# Patient Record
Sex: Female | Born: 1970 | Marital: Married | Smoking: Former smoker
Health system: Southern US, Community
[De-identification: ages and names within clinical notes are randomized; demographics above are authoritative.]

## PROBLEM LIST (undated history)

## (undated) DIAGNOSIS — K449 Diaphragmatic hernia without obstruction or gangrene: Secondary | ICD-10-CM

## (undated) DIAGNOSIS — E669 Obesity, unspecified: Secondary | ICD-10-CM

## (undated) DIAGNOSIS — I10 Essential (primary) hypertension: Secondary | ICD-10-CM

## (undated) DIAGNOSIS — E119 Type 2 diabetes mellitus without complications: Secondary | ICD-10-CM

## (undated) HISTORY — PX: APPENDECTOMY: SHX54

---

## 2000-11-01 ENCOUNTER — Encounter (HOSPITAL_BASED_OUTPATIENT_CLINIC_OR_DEPARTMENT_OTHER): Payer: Self-pay | Admitting: General Surgery

## 2000-11-01 ENCOUNTER — Inpatient Hospital Stay (HOSPITAL_COMMUNITY): Admission: EM | Admit: 2000-11-01 | Discharge: 2000-11-05 | Payer: Self-pay | Admitting: Emergency Medicine

## 2001-10-25 ENCOUNTER — Inpatient Hospital Stay (HOSPITAL_COMMUNITY): Admission: AD | Admit: 2001-10-25 | Discharge: 2001-10-28 | Payer: Self-pay | Admitting: Obstetrics and Gynecology

## 2001-12-04 ENCOUNTER — Other Ambulatory Visit: Admission: RE | Admit: 2001-12-04 | Discharge: 2001-12-04 | Payer: Self-pay | Admitting: Obstetrics and Gynecology

## 2001-12-22 ENCOUNTER — Other Ambulatory Visit: Admission: RE | Admit: 2001-12-22 | Discharge: 2001-12-22 | Payer: Self-pay | Admitting: Obstetrics and Gynecology

## 2002-02-03 ENCOUNTER — Other Ambulatory Visit: Admission: RE | Admit: 2002-02-03 | Discharge: 2002-02-03 | Payer: Self-pay | Admitting: Obstetrics and Gynecology

## 2005-01-29 ENCOUNTER — Inpatient Hospital Stay (HOSPITAL_COMMUNITY): Admission: AD | Admit: 2005-01-29 | Discharge: 2005-01-31 | Payer: Self-pay | Admitting: Obstetrics and Gynecology

## 2005-08-02 ENCOUNTER — Emergency Department (HOSPITAL_COMMUNITY): Admission: EM | Admit: 2005-08-02 | Discharge: 2005-08-02 | Payer: Self-pay | Admitting: Emergency Medicine

## 2013-07-25 ENCOUNTER — Emergency Department (HOSPITAL_COMMUNITY)
Admission: EM | Admit: 2013-07-25 | Discharge: 2013-07-25 | Disposition: A | Discharge status: Home / Self Care | Payer: PRIVATE HEALTH INSURANCE | Attending: Emergency Medicine | Admitting: Emergency Medicine

## 2013-07-25 ENCOUNTER — Encounter (HOSPITAL_COMMUNITY): Payer: Self-pay | Admitting: Emergency Medicine

## 2013-07-25 ENCOUNTER — Emergency Department (HOSPITAL_COMMUNITY): Payer: PRIVATE HEALTH INSURANCE

## 2013-07-25 DIAGNOSIS — IMO0002 Reserved for concepts with insufficient information to code with codable children: Secondary | ICD-10-CM | POA: Insufficient documentation

## 2013-07-25 DIAGNOSIS — I10 Essential (primary) hypertension: Secondary | ICD-10-CM | POA: Insufficient documentation

## 2013-07-25 DIAGNOSIS — Z87891 Personal history of nicotine dependence: Secondary | ICD-10-CM | POA: Insufficient documentation

## 2013-07-25 DIAGNOSIS — Z79899 Other long term (current) drug therapy: Secondary | ICD-10-CM | POA: Insufficient documentation

## 2013-07-25 DIAGNOSIS — Z8719 Personal history of other diseases of the digestive system: Secondary | ICD-10-CM | POA: Insufficient documentation

## 2013-07-25 DIAGNOSIS — S43014A Anterior dislocation of right humerus, initial encounter: Secondary | ICD-10-CM

## 2013-07-25 DIAGNOSIS — S43016A Anterior dislocation of unspecified humerus, initial encounter: Secondary | ICD-10-CM | POA: Insufficient documentation

## 2013-07-25 DIAGNOSIS — E669 Obesity, unspecified: Secondary | ICD-10-CM | POA: Insufficient documentation

## 2013-07-25 DIAGNOSIS — Y93E1 Activity, personal bathing and showering: Secondary | ICD-10-CM | POA: Insufficient documentation

## 2013-07-25 DIAGNOSIS — Y92009 Unspecified place in unspecified non-institutional (private) residence as the place of occurrence of the external cause: Secondary | ICD-10-CM | POA: Insufficient documentation

## 2013-07-25 HISTORY — DX: Diaphragmatic hernia without obstruction or gangrene: K44.9

## 2013-07-25 HISTORY — DX: Essential (primary) hypertension: I10

## 2013-07-25 HISTORY — DX: Obesity, unspecified: E66.9

## 2013-07-25 MED ORDER — OXYCODONE-ACETAMINOPHEN 5-325 MG PO TABS: 1.0000 | ORAL_TABLET | Freq: Four times a day (QID) | ORAL | Status: DC | PRN

## 2013-07-25 MED ORDER — ETOMIDATE 2 MG/ML IV SOLN
5.0000 mg | Freq: Once | INTRAVENOUS | Status: AC
  Administered 2013-07-25: 5 mg via INTRAVENOUS

## 2013-07-25 MED ORDER — ETOMIDATE 2 MG/ML IV SOLN
15.0000 mg | Freq: Once | INTRAVENOUS | Status: DC
  Filled 2013-07-25: qty 10

## 2013-07-25 MED ORDER — HYDROMORPHONE HCL PF 1 MG/ML IJ SOLN
1.0000 mg | Freq: Once | INTRAMUSCULAR | Status: AC
  Administered 2013-07-25: 1 mg via INTRAMUSCULAR
  Filled 2013-07-25: qty 1

## 2013-07-25 NOTE — ED Notes (Signed)
VSS throughout procedure, BP did elevate to 160/124 during manipulation of shoulder but decreased to normal shortly after. Pt able to respond verbally throughout procedure. SpO2 maintained above 95% skin remained pink and dry. [place]ubag, suction and crash cart readily available throughout procedure.

## 2013-07-25 NOTE — ED Notes (Signed)
Patient transported to X-ray 

## 2013-07-25 NOTE — ED Notes (Signed)
Pt asked for family to be updated. Husband was updated about procedure and pt's condition.

## 2013-07-25 NOTE — ED Notes (Signed)
Pt states she was stepping into the shower and fell. Pt reached out with right arm to gain balance and arm/hand hit wall and "jammed" arm. Pt complains of shoulder pain but pain radiates down arm. Pt is able to move fingers and pulse is palpable in right wrist.

## 2013-07-25 NOTE — ED Provider Notes (Signed)
CSN: [id number]     Arrival date & time 07/25/13  4010 History   First MD Initiated Contact with Patient 07/25/13 [phone]     Chief Complaint  Patient presents with  . Shoulder Pain    Fall   (Consider location/radiation/quality/duration/timing/severity/associated sxs/prior Treatment) Patient is a 42 y.o. female presenting with shoulder pain. The history is provided by the patient.  Shoulder Pain This is a new problem. The current episode started less than 1 hour ago. Episode frequency: once. The problem has not changed since onset.Pertinent negatives include no chest pain, no abdominal pain, no headaches and no shortness of breath. Exacerbated by: movement of right shoulder. Nothing relieves the symptoms. She has tried nothing for the symptoms. The treatment provided no relief.    Past Medical History  Diagnosis Date  . Hypertension   . Obesity   . Hiatal hernia    Past Surgical History  Procedure Laterality Date  . Appendectomy     History reviewed. No pertinent family history. History  Substance Use Topics  . Smoking status: Former Smoker    Quit date: 09/25/2012  . Smokeless tobacco: Not on file  . Alcohol Use: No   OB History   Grav Para Term Preterm Abortions TAB SAB Ect Mult Living                 Review of Systems  Constitutional: Negative for fever and fatigue.  HENT: Negative for congestion and drooling.   Eyes: Negative for pain.  Respiratory: Negative for cough and shortness of breath.   Cardiovascular: Negative for chest pain.  Gastrointestinal: Negative for nausea, vomiting, abdominal pain and diarrhea.  Genitourinary: Negative for dysuria and hematuria.  Musculoskeletal: Negative for back pain, gait problem and neck pain.  Skin: Negative for color change.  Neurological: Negative for dizziness and headaches.  Hematological: Negative for adenopathy.  Psychiatric/Behavioral: Negative for behavioral problems.  All other systems reviewed and are  negative.    Allergies  Review of patient's allergies indicates not on file.  Home Medications  No current outpatient prescriptions on file. LMP 07/11/2013 Physical Exam  Nursing note and vitals reviewed. Constitutional: She is oriented to person, place, and time. She appears well-developed and well-nourished.  HENT:  Head: Normocephalic.  Mouth/Throat: Oropharynx is clear and moist. No oropharyngeal exudate.  Eyes: Conjunctivae and EOM are normal. Pupils are equal, round, and reactive to light.  Neck: Normal range of motion. Neck supple.  Cardiovascular: Normal rate, regular rhythm, normal heart sounds and intact distal pulses.  Exam reveals no gallop and no friction rub.   No murmur heard. Pulmonary/Chest: Effort normal and breath sounds normal. No respiratory distress. She has no wheezes.  Abdominal: Soft. Bowel sounds are normal. There is no tenderness. There is no rebound and no guarding.  Musculoskeletal: Normal range of motion. She exhibits no edema and no tenderness.  Sensation intact diffusely in the right upper extremity.  2+ distal pulses in the right upper extremity.  Normal range of motion of the digits of the right hand in the right wrist.  Focal tenderness to palpation of the right lateral shoulder.  Neurological: She is alert and oriented to person, place, and time.  Skin: Skin is warm and dry.  Psychiatric: She has a normal mood and affect. Her behavior is normal.    ED Course  Reduction of dislocation Date/Time: 07/26/2013 11:16 AM Performed by: Purvis Sheffield, S Authorized by: Purvis Sheffield, S Consent: Verbal consent obtained. written consent obtained. Risks and benefits:  risks, benefits and alternatives were discussed Consent given by: patient Patient understanding: patient states understanding of the procedure being performed Patient consent: the patient's understanding of the procedure matches consent given Procedure consent: procedure consent  matches procedure scheduled Relevant documents: relevant documents present and verified Test results: test results available and properly labeled Site marked: the operative site was marked Imaging studies: imaging studies available Required items: required blood products, implants, devices, and special equipment available Patient identity confirmed: verbally with patient, arm band, provided demographic data, hospital-assigned identification number and anonymous protocol, patient vented/unresponsive Time out: Immediately prior to procedure a "time out" was called to verify the correct patient, procedure, equipment, support staff and site/side marked as required. Preparation: Patient was prepped and draped in the usual sterile fashion. Local anesthesia used: no Patient sedated: yes Sedation type: moderate (conscious) sedation Sedatives: etomidate (two separate boluses of 5mg  (10mg  in total)) Sedation start date/time: 07/25/2013 11:48 AM Sedation end date/time: 07/25/2013 12:05 PM Vitals: Vital signs were monitored during sedation. Patient tolerance: Patient tolerated the procedure well with no immediate complications.   (including critical care time) Labs Review Labs Reviewed - No data to display Imaging Review Dg Shoulder Right  07/25/2013   CLINICAL DATA:  Larey Seat while in the shower earlier today and injured the right shoulder.  EXAM: RIGHT SHOULDER - 2+ VIEW  COMPARISON:  None.  FINDINGS: Anterior glenohumeral dislocation. No visible fractures. Acromioclavicular joint intact with minimal degenerative changes.  IMPRESSION: Anterior glenohumeral dislocation.   Electronically Signed   By: Hulan Saas M.D.   On: 07/25/2013 09:54   Dg Shoulder Right Port  07/25/2013   CLINICAL DATA:  Status post reduction  EXAM: PORTABLE RIGHT SHOULDER - 2+ VIEW  COMPARISON:  Shoulder film from earlier in the same day  FINDINGS: There has been relocation of the right humeral head with respect to the  glenoid. Acromioclavicular joint degenerative changes are again noted. No acute fracture is seen.   Electronically Signed   By: Alcide Clever M.D.   On: 07/25/2013 12:40    EKG Interpretation   None       MDM   1. Anterior shoulder dislocation, right, initial encounter    9:04 AM 42 y.o. female who presents with a mechanical fall which occurred prior to arrival. She states that she was stepping into the shower when she lost her balance and jammed her right arm against the wall. She is currently complaining of right shoulder pain. She denies hitting her head or loss of consciousness. She has no other focal tenderness to palpation on exam and no other complaints. Will get screening imaging and pain control with intramuscular Dilaudid.  Found to have right anterior shoulder dislocation. I attempted shoulder massage and fares technique for bedside shoulder reduction w/out success. The pt was consented for conscious sedation.   1:43 PM: The patient tolerated the procedure well. Now awake/alert and pain controlled. Re-examination of RUE shows no motor/sensory deficits, 2+ distal pulses. Repeat imaging shows appropriate reduction. Placed in sling w/ frozen shoulder precautions.  I have discussed the diagnosis/risks/treatment options with the patient and believe the pt to be eligible for discharge home to follow-up with ortho within the next week. We also discussed returning to the ED immediately if new or worsening sx occur. We discussed the sx which are most concerning (e.g., worsening pain, concern for re-dislocation) that necessitate immediate return. Any new prescriptions provided to the patient are listed below.  New Prescriptions   OXYCODONE-ACETAMINOPHEN (PERCOCET) 5-325 MG  PER TABLET    Take 1 tablet by mouth every 6 (six) hours as needed for moderate pain.     Junius Argyle, MD 07/26/13 1124

## 2013-07-25 NOTE — ED Notes (Signed)
Arkisha Pod B secretary called XR for portable post reduction xr

## 2013-12-22 ENCOUNTER — Other Ambulatory Visit: Payer: Self-pay | Admitting: Family Medicine

## 2013-12-22 DIAGNOSIS — Z1231 Encounter for screening mammogram for malignant neoplasm of breast: Secondary | ICD-10-CM

## 2013-12-27 ENCOUNTER — Ambulatory Visit
Admission: RE | Admit: 2013-12-27 | Discharge: 2013-12-27 | Disposition: A | Discharge status: Home / Self Care | Payer: PRIVATE HEALTH INSURANCE | Source: Ambulatory Visit | Attending: Family Medicine | Admitting: Family Medicine

## 2013-12-27 DIAGNOSIS — Z1231 Encounter for screening mammogram for malignant neoplasm of breast: Secondary | ICD-10-CM

## 2013-12-29 ENCOUNTER — Other Ambulatory Visit: Payer: Self-pay | Admitting: Family Medicine

## 2013-12-29 DIAGNOSIS — R928 Other abnormal and inconclusive findings on diagnostic imaging of breast: Secondary | ICD-10-CM

## 2014-01-10 ENCOUNTER — Ambulatory Visit
Admission: RE | Admit: 2014-01-10 | Discharge: 2014-01-10 | Disposition: A | Discharge status: Home / Self Care | Payer: PRIVATE HEALTH INSURANCE | Source: Ambulatory Visit | Attending: Family Medicine | Admitting: Family Medicine

## 2014-01-10 DIAGNOSIS — R928 Other abnormal and inconclusive findings on diagnostic imaging of breast: Secondary | ICD-10-CM

## 2014-08-26 ENCOUNTER — Other Ambulatory Visit: Payer: Self-pay | Admitting: Family Medicine

## 2014-08-26 DIAGNOSIS — R921 Mammographic calcification found on diagnostic imaging of breast: Secondary | ICD-10-CM

## 2014-09-12 ENCOUNTER — Ambulatory Visit
Admission: RE | Admit: 2014-09-12 | Discharge: 2014-09-12 | Disposition: A | Discharge status: Home / Self Care | Payer: PRIVATE HEALTH INSURANCE | Source: Ambulatory Visit | Attending: Family Medicine | Admitting: Family Medicine

## 2014-09-12 ENCOUNTER — Other Ambulatory Visit: Payer: Self-pay | Admitting: Family Medicine

## 2014-09-12 DIAGNOSIS — N6320 Unspecified lump in the left breast, unspecified quadrant: Secondary | ICD-10-CM

## 2014-09-12 DIAGNOSIS — R921 Mammographic calcification found on diagnostic imaging of breast: Secondary | ICD-10-CM

## 2014-09-19 ENCOUNTER — Other Ambulatory Visit: Payer: Self-pay | Admitting: Family Medicine

## 2014-09-19 DIAGNOSIS — N6320 Unspecified lump in the left breast, unspecified quadrant: Secondary | ICD-10-CM

## 2014-09-22 ENCOUNTER — Ambulatory Visit
Admission: RE | Admit: 2014-09-22 | Discharge: 2014-09-22 | Disposition: A | Discharge status: Home / Self Care | Payer: PRIVATE HEALTH INSURANCE | Source: Ambulatory Visit | Attending: Family Medicine | Admitting: Family Medicine

## 2014-09-22 DIAGNOSIS — N6320 Unspecified lump in the left breast, unspecified quadrant: Secondary | ICD-10-CM

## 2014-09-22 HISTORY — PX: BREAST BIOPSY: SHX20

## 2014-12-23 ENCOUNTER — Emergency Department (HOSPITAL_COMMUNITY)
Admission: EM | Admit: 2014-12-23 | Discharge: 2014-12-23 | Disposition: A | Discharge status: Home / Self Care | Payer: PRIVATE HEALTH INSURANCE | Attending: Emergency Medicine | Admitting: Emergency Medicine

## 2014-12-23 ENCOUNTER — Encounter (HOSPITAL_COMMUNITY): Payer: Self-pay | Admitting: Emergency Medicine

## 2014-12-23 ENCOUNTER — Emergency Department (HOSPITAL_COMMUNITY): Payer: PRIVATE HEALTH INSURANCE

## 2014-12-23 DIAGNOSIS — E669 Obesity, unspecified: Secondary | ICD-10-CM | POA: Diagnosis not present

## 2014-12-23 DIAGNOSIS — M545 Low back pain, unspecified: Secondary | ICD-10-CM

## 2014-12-23 DIAGNOSIS — Z8719 Personal history of other diseases of the digestive system: Secondary | ICD-10-CM | POA: Insufficient documentation

## 2014-12-23 DIAGNOSIS — I10 Essential (primary) hypertension: Secondary | ICD-10-CM | POA: Insufficient documentation

## 2014-12-23 DIAGNOSIS — E119 Type 2 diabetes mellitus without complications: Secondary | ICD-10-CM | POA: Insufficient documentation

## 2014-12-23 DIAGNOSIS — Z87891 Personal history of nicotine dependence: Secondary | ICD-10-CM | POA: Insufficient documentation

## 2014-12-23 DIAGNOSIS — Z79899 Other long term (current) drug therapy: Secondary | ICD-10-CM | POA: Insufficient documentation

## 2014-12-23 HISTORY — DX: Type 2 diabetes mellitus without complications: E11.9

## 2014-12-23 MED ORDER — CYCLOBENZAPRINE HCL 10 MG PO TABS: 10.0000 mg | ORAL_TABLET | Freq: Two times a day (BID) | ORAL | Status: DC | PRN

## 2014-12-23 MED ORDER — KETOROLAC TROMETHAMINE 60 MG/2ML IM SOLN
60.0000 mg | Freq: Once | INTRAMUSCULAR | Status: AC
  Administered 2014-12-23: 60 mg via INTRAMUSCULAR
  Filled 2014-12-23: qty 2

## 2014-12-23 NOTE — ED Notes (Signed)
Patient ambulated to restroom and tolerated fair.

## 2014-12-23 NOTE — ED Provider Notes (Signed)
CSN: [id number]     Arrival date & time 12/23/14  1115 History  This chart was scribed for non-physician practitioner, Oswaldo ConroyVictoria Arul Farabee, working with Gilda Creasehristopher J Pollina, MD by Richarda Overlieichard Holland, ED Scribe. This patient was seen in room TR11C/TR11C and the patient's care was started at 11:21 AM.   Chief Complaint  Patient presents with  . Back Pain   HPI HPI Comments: [name] is a 44 y.o. female with a history of HTN and DM who presents to the Emergency Department complaining of constant, sudden lower back pain that started this morning. Pt says that she has a history of back pain and describes her typical pain as an intermittent soreness. She states her current pain is different because it radiates up her back. She states that her pain does not radiate to her legs. Pt denies any recent falls or injuries. Pain worse with ambulation and movement. She says that she took Ibuprofen 400mg  around 9:30AM this morning with some relief. Pt reports she takes aleve or ibuprofen for her hx of back pain. She denies any hx of IVDU. She states that she does not use anything to ambulate with. Pt denies abdominal pain, dysuria, fever, chills, weight loss, numbness, weakness, bowel or bladder incontinence or saddle anesthesia.   Past Medical History  Diagnosis Date  . Hypertension   . Obesity   . Hiatal hernia   . Diabetes mellitus without complication    Past Surgical History  Procedure Laterality Date  . Appendectomy     No family history on file. History  Substance Use Topics  . Smoking status: Former Smoker    Quit date: 09/25/2012  . Smokeless tobacco: Not on file  . Alcohol Use: No   OB History    No data available     Review of Systems  Constitutional: Negative for fever, chills and unexpected weight change.  Gastrointestinal: Negative for abdominal pain.  Genitourinary: Negative for dysuria and frequency.  Musculoskeletal: Positive for myalgias and back pain.  Neurological: Negative  for weakness and numbness.    Allergies  Review of patient's allergies indicates no known allergies.  Home Medications   Prior to Admission medications   Medication Sig Start Date End Date Taking? Authorizing Provider  cyclobenzaprine (FLEXERIL) 10 MG tablet Take 1 tablet (10 mg total) by mouth 2 (two) times daily as needed for muscle spasms. 12/23/14   Oswaldo ConroyVictoria Tejas [place]wood, PA-C  Lansoprazole (PREVACID PO) Take 1 capsule by mouth daily as needed (reflux).    Historical Provider, MD  lisinopril (PRINIVIL,ZESTRIL) 10 MG tablet Take 10 mg by mouth daily.    Historical Provider, MD  oxyCODONE-acetaminophen (PERCOCET) 5-325 MG per tablet Take 1 tablet by mouth every 6 (six) hours as needed for moderate pain. 07/25/13   Purvis SheffieldForrest Harrison, MD   BP 133/54 mmHg  Pulse 81  Temp(Src) 97.4 F (36.3 C) (Oral)  Resp 18  Ht 5\' 7"  (1.702 m)  Wt 350 lb (158.759 kg)  BMI 54.80 kg/m2  SpO2 100%  LMP 12/05/2014 (Exact Date) Physical Exam  Constitutional: She appears well-developed and well-nourished. No distress.  HENT:  Head: Normocephalic and atraumatic.  Eyes: Conjunctivae are normal. Right eye exhibits no discharge. Left eye exhibits no discharge.  Cardiovascular: Normal rate, regular rhythm and normal heart sounds.   Pulmonary/Chest: Effort normal and breath sounds normal. No respiratory distress. She has no wheezes.  Abdominal: Soft. Bowel sounds are normal. She exhibits no distension. There is no tenderness.  Musculoskeletal: She exhibits tenderness.  No  midline back tenderness, step off or crepitus. Bilateral lower back tenderness. No CVA tenderness.  Neurological: She is alert. Coordination normal.  Equal muscle tone. 5/5 strength in lower extremities. DTR equal and intact. Negative straight leg test. Antalgic gait.   Skin: Skin is warm and dry. She is not diaphoretic.  Nursing note and vitals reviewed.   ED Course  Procedures   DIAGNOSTIC STUDIES: Oxygen Saturation is 99% on RA, normal  by my interpretation.    COORDINATION OF CARE: 11:28 AM Discussed treatment plan with pt at bedside and pt agreed to plan.   Labs Review Labs Reviewed - No data to display  Imaging Review Dg Lumbar Spine Complete  12/23/2014   CLINICAL DATA:  Sudden onset of severe low back pain.  EXAM: LUMBAR SPINE - COMPLETE 4+ VIEW  COMPARISON:  None.  FINDINGS: There is no evidence of lumbar spine fracture. Alignment is normal. Intervertebral disc spaces are maintained.  IMPRESSION: Negative.   Electronically Signed   By: Paulina FusiMark  Shogry M.D.   On: 12/23/2014 12:35     EKG Interpretation None      Meds given in ED:  Medications  ketorolac (TORADOL) injection 60 mg (60 mg Intramuscular Given 12/23/14 1142)    Discharge Medication List as of 12/23/2014 12:42 PM    START taking these medications   Details  cyclobenzaprine (FLEXERIL) 10 MG tablet Take 1 tablet (10 mg total) by mouth 2 (two) times daily as needed for muscle spasms., Starting 12/23/2014, Until Discontinued, Print          MDM   Final diagnoses:  Bilateral low back pain without sciatica   Pt presenting with bilateral nonradiating back pain. No saddle anesthesia. No loss of control of bladder and bowel. No IVDU. VSS. Normal neurological exam. Pt ambulatory. Xray with normal alignment and maintained disc spaces. I doubt cauda equina. Ibuprofen and RICE discussed. Flexeril script provided. Driving and sedation precautions provided. Patient is afebrile, nontoxic, and in no acute distress. Patient is appropriate for outpatient management and is stable for discharge. Pt to follow up with orthopedics.  Discussed return precautions with patient. Discussed all results and patient verbalizes understanding and agrees with plan.  I personally performed the services described in this documentation, which was scribed in my presence. The recorded information has been reviewed and is accurate.    Oswaldo ConroyVictoria Javarri Segal, PA-C 12/23/14  1452  Gilda Creasehristopher J Pollina, MD 12/24/14 1220

## 2014-12-23 NOTE — ED Notes (Signed)
Pt walking, had sudden LBP, non-radiating.

## 2014-12-23 NOTE — Discharge Instructions (Signed)
Return to the emergency room with worsening of symptoms, new symptoms or with symptoms that are concerning , especially fevers, loss of control of bladder or bowels, numbness or tingling around genital region or anus, weakness. RICE: Rest, Ice (three cycles of 20 mins on, 56mns off at least twice a day), compression/brace, elevation. Heating pad works well for back pain. Ibuprofen 4085m(2 tablets 200107mevery 5-6 hours for 3-5 days. Flexeril for severe pain. Follow up with PCP/orthopedist if symptoms worsen or are persistent. Read below information and follow recommendations.  Back Injury Prevention Back injuries can be extremely painful and difficult to heal. After having one back injury, you are much more likely to experience another later on. It is important to learn how to avoid injuring or re-injuring your back. The following tips can help you to prevent a back injury. PHYSICAL FITNESS  Exercise regularly and try to develop good tone in your abdominal muscles. Your abdominal muscles provide a lot of the support needed by your back.  Do aerobic exercises (walking, jogging, biking, swimming) regularly.  Do exercises that increase balance and strength (tai chi, yoga) regularly. This can decrease your risk of falling and injuring your back.  Stretch before and after exercising.  Maintain a healthy weight. The more you weigh, the more stress is placed on your back. For every pound of weight, 10 times that amount of pressure is placed on the back. DIET  Talk to your caregiver about how much calcium and vitamin D you need per day. These nutrients help to prevent weakening of the bones (osteoporosis). Osteoporosis can cause broken (fractured) bones that lead to back pain.  Include good sources of calcium in your diet, such as dairy products, green, leafy vegetables, and products with calcium added (fortified).  Include good sources of vitamin D in your diet, such as milk and foods that are  fortified with vitamin D.  Consider taking a nutritional supplement or a multivitamin if needed.  Stop smoking if you smoke. POSTURE  Sit and stand up straight. Avoid leaning forward when you sit or hunching over when you stand.  Choose chairs with good low back (lumbar) support.  If you work at a desk, sit close to your work so you do not need to lean over. Keep your chin tucked in. Keep your neck drawn back and elbows bent at a right angle. Your arms should look like the letter "L."  Sit high and close to the steering wheel when you drive. Add a lumbar support to your car seat if needed.  Avoid sitting or standing in one position for too long. Take breaks to get up, stretch, and walk around at least once every hour. Take breaks if you are driving for long periods of time.  Sleep on your side with your knees slightly bent, or sleep on your back with a pillow under your knees. Do not sleep on your stomach. LIFTING, TWISTING, AND REACHING  Avoid heavy lifting, especially repetitive lifting. If you must do heavy lifting:  Stretch before lifting.  Work slowly.  Rest between lifts.  Use carts and dollies to move objects when possible.  Make several small trips instead of carrying 1 heavy load.  Ask for help when you need it.  Ask for help when moving big, awkward objects.  Follow these steps when lifting:  Stand with your feet shoulder-width apart.  Get as close to the object as you can. Do not try to pick up heavy objects that are  far from your body.  Use handles or lifting straps if they are available.  Bend at your knees. Squat down, but keep your heels off the floor.  Keep your shoulders pulled back, your chin tucked in, and your back straight.  Lift the object slowly, tightening the muscles in your legs, abdomen, and buttocks. Keep the object as close to the center of your body as possible.  When you put a load down, use these same guidelines in reverse.  Do  not:  Lift the object above your waist.  Twist at the waist while lifting or carrying a load. Move your feet if you need to turn, not your waist.  Bend over without bending at your knees.  Avoid reaching over your head, across a table, or for an object on a high surface. OTHER TIPS  Avoid wet floors and keep sidewalks clear of ice to prevent falls.  Do not sleep on a mattress that is too soft or too hard.  Keep items that are used frequently within easy reach.  Put heavier objects on shelves at waist level and lighter objects on lower or higher shelves.  Find ways to decrease your stress, such as exercise, massage, or relaxation techniques. Stress can build up in your muscles. Tense muscles are more vulnerable to injury.  Seek treatment for depression or anxiety if needed. These conditions can increase your risk of developing back pain. SEEK MEDICAL CARE IF:  You injure your back.  You have questions about diet, exercise, or other ways to prevent back injuries. MAKE SURE YOU:  Understand these instructions.  Will watch your condition.  Will get help right away if you are not doing well or get worse. Document Released: 09/05/2004 Document Revised: 10/21/2011 Document Reviewed: 09/09/2011 [facility] Patient Information 2015 [place], [place]. This information is not intended to replace advice given to you by your health care provider. Make sure you discuss any questions you have with your health care provider.

## 2015-01-13 ENCOUNTER — Ambulatory Visit: Payer: PRIVATE HEALTH INSURANCE | Attending: Orthopaedic Surgery | Admitting: Physical Therapy

## 2015-01-13 DIAGNOSIS — M62830 Muscle spasm of back: Secondary | ICD-10-CM | POA: Insufficient documentation

## 2015-01-13 DIAGNOSIS — M545 Low back pain, unspecified: Secondary | ICD-10-CM

## 2015-01-13 NOTE — Therapy (Signed)
[facility] [address] [place], [place], [address] Phone: [phone]   Fax:  [phone]  Physical Therapy Evaluation  Patient Details  Name: Daisy Turner MRN: [id number] Date of Birth: [birth year] Referring Provider:  Kathryne Hitch*  Encounter Date: 01/13/2015      PT End of Session - 01/13/15 1057    Visit Number 1   Number of Visits 8   Date for PT Re-Evaluation 03/10/15   PT Start Time 1017   PT Stop Time 1055   PT Time Calculation (min) 38 min   Activity Tolerance Patient tolerated treatment well      Past Medical History  Diagnosis Date  . Hypertension   . Obesity   . Hiatal hernia   . Diabetes mellitus without complication     Past Surgical History  Procedure Laterality Date  . Appendectomy      There were no vitals filed for this visit.  Visit Diagnosis:  Nonspecific pain in the lumbar region  Morbid obesity  Muscle spasm of back      Subjective Assessment - 01/13/15 1023    Subjective Patient reports muscle spasm at work which she cannot relate to specific activity. She "could not move", went to ED.  She cont to have pain in low back, intermittently with certain activities.  Denies radicular sx, weakness and sensory sx.  She has had difficulty with sit to stand, doing ADLs, and any activitiy.  She has a sedentary job, sits alot at home.     Pertinent History Mechanical low back pain intermittently for years, bilateral knee pain   Limitations Sitting;Lifting;Standing;Walking;House hold activities  When in pain, today no diff.    Diagnostic tests XR in ED 3 weeks ago, neg   Patient Stated Goals Prevent this from happening again   Currently in Pain? No/denies   Multiple Pain Sites Yes   Pain Score 4   Pain Location Knee   Pain Orientation Right;Left   Pain Type Chronic pain   Pain Onset More than a month ago   Pain Frequency Intermittent            [facility] PT Assessment - 01/13/15  1029    Assessment   Medical Diagnosis low back pain    Onset Date/Surgical Date 12/23/14   Next MD Visit 02/01/15   Prior Therapy none   Precautions   Precautions None   Restrictions   Weight Bearing Restrictions No   Balance Screen   Has the patient fallen in the past 6 months No   Has the patient had a decrease in activity level because of a fear of falling?  No   Is the patient reluctant to leave their home because of a fear of falling?  No   Home Environment   Living Environment Private residence   Living Arrangements Spouse/significant other   Type of Home House   Home Access Stairs to enter   Entrance Stairs-Number of Steps 3   Prior Function   Level of Independence Independent   Cognition   Overall Cognitive Status Within Functional Limits for tasks assessed   Observation/Other Assessments   Focus on Therapeutic Outcomes (FOTO)  64%   Sensation   Light Touch Appears Intact   Coordination   Gross Motor Movements are Fluid and Coordinated Yes   Posture/Postural Control   Posture/Postural Control No significant limitations   Posture Comments obese abdomen    AROM   Lumbar Flexion touches distal shin   Lumbar  Extension WNL   Lumbar - Right Side Bend WNL    Lumbar - Left Side Bend WNL with central discomfort   Lumbar - Right Rotation WNL   Lumbar - Left Rotation WNL with discomfort on L    Strength   Right Hip Flexion 4+/5   Right Hip ABduction 4+/5   Left Hip Flexion 4+/5   Left Hip ABduction 4+/5   Right Knee Flexion 4+/5   Right Knee Extension 4+/5   Left Knee Flexion 4/5   Left Knee Extension 4/5   Palpation   Spinal mobility did not test due to difficulty with prone   Palpation comment tender just lateral to Lt L4-L5 area.> than Rt. side, no pain elicited   Special Tests   Lumbar Tests Straight Leg Raise   Straight Leg Raise   Findings Negative   Side  Right   Comment neg bilat              PT Education - 01/13/15 1051    Education provided  Yes   Education Details PT/POC, posture and body mechanics   Person(s) Educated Patient   Methods Explanation;Demonstration;Handout   Comprehension Verbalized understanding;Verbal cues required          PT Short Term Goals - 01/13/15 1101    PT SHORT TERM GOAL #1   Title Pt will be I with Initial HEP   Time 4   Status New   PT SHORT TERM GOAL #2   Title Pt will report less pain with ADLs, housework and reduction of m spasms   Time 4   Period Weeks   Status New           PT Long Term Goals - 01/13/15 1102    PT LONG TERM GOAL #1   Title Pt will be I with concepts of posture, lifting and core to prevent re-injury.    Time 8   Period Weeks   Status New   PT LONG TERM GOAL #2   Title Pt will report no pain with transitions, ADLs and standing for housework, consistently   Time 8   Period Weeks   Status New   PT LONG TERM GOAL #3   Title Pt will demo FOTO <40% limited    Time 8   Period Weeks   Status New               Plan - 01/13/15 1058    Clinical Impression Statement Patient with recent history of centralized low back pain and muscle spasm.  Pain has recurred only 1-2 days since her initial episode.  She had no pain today. Pain is likely due to poor body mechanics, obesity and deconditioning./core weakness.  She will benefit from skilled PT to provide education on proper mechanics for gardening, housework. and HEP for basic core ex.    Pt will benefit from skilled therapeutic intervention in order to improve on the following deficits Increased fascial restricitons;Obesity;Increased muscle spasms;Decreased activity tolerance;Pain;Improper body mechanics;Decreased strength;Decreased mobility;Postural dysfunction   Rehab Potential Good   PT Frequency 1x / week   PT Duration 8 weeks   PT Treatment/Interventions Moist Heat;Iontophoresis /ml Dexamethasone;Electrical Stimulation;Ultrasound;Therapeutic exercise;Manual techniques;Therapeutic  activities;Patient/family education   PT Next Visit Plan develop basic core HEP   PT Home Exercise Plan gavew abdominal/post pelvic tilt today   Consulted and Agree with Plan of Care Patient         Problem List Patient Active Problem List   Diagnosis Date Noted  .  Anterior shoulder dislocation 07/25/2013    PAA,JENNIFER 01/13/2015, 11:10 AM  [facility] [address] [place], [place], [address] Phone: [phone]   Fax:  [phone]   Karie MainlandJennifer Paa, PT 01/13/2015 11:10 AM Phone: [phone] Fax: [phone]

## 2015-01-13 NOTE — Patient Instructions (Signed)

## 2015-01-16 ENCOUNTER — Ambulatory Visit: Payer: PRIVATE HEALTH INSURANCE | Admitting: Physical Therapy

## 2015-01-16 DIAGNOSIS — M545 Low back pain, unspecified: Secondary | ICD-10-CM

## 2015-01-16 DIAGNOSIS — M62830 Muscle spasm of back: Secondary | ICD-10-CM

## 2015-01-16 NOTE — Patient Instructions (Signed)

## 2015-01-16 NOTE — Therapy (Signed)
[facility] [address] [place], [place], [address] Phone: [phone]   Fax:  [phone]  Physical Therapy Treatment  Patient Details  Name: Daisy Turner MRN: [id number] Date of Birth: [birth year] Referring Provider:  Kathryne Hitch*  Encounter Date: 01/16/2015      PT End of Session - 01/16/15 0854    Visit Number 2   Number of Visits 8   Date for PT Re-Evaluation 03/10/15   PT Start Time 0846   PT Stop Time 0930   PT Time Calculation (min) 44 min   Activity Tolerance Patient tolerated treatment well      Past Medical History  Diagnosis Date  . Hypertension   . Obesity   . Hiatal hernia   . Diabetes mellitus without complication     Past Surgical History  Procedure Laterality Date  . Appendectomy      There were no vitals filed for this visit.  Visit Diagnosis:  Nonspecific pain in the lumbar region  Morbid obesity  Muscle spasm of back      Subjective Assessment - 01/16/15 0850    Subjective No pain in back today, had some yesterday.  knee pain flared up after eval.     Currently in Pain? No/denies                         [facility] Adult PT Treatment/Exercise - 01/16/15 0911    Lumbar Exercises: Aerobic   Stationary Bike NuStep L6, UE and LE for 5 min    Lumbar Exercises: Standing   Wall Slides 10 reps;5 seconds   Other Standing Lumbar Exercises diagonal pull 1 plate, cues for trunk stability    Lumbar Exercises: Supine   Ab Set 15 reps   Clam 15 reps   Clam Limitations bilat., unilateral   Heel Slides 15 reps   Heel Slides Limitations pain with knee motion    Bent Knee Raise 10 reps   Straight Leg Raise 10 reps   Straight Leg Raises Limitations pain in Rt. knee min    Other Supine Lumbar Exercises --                PT Education - 01/16/15 1324    Education provided Yes   Education Details lifting, body mech, hip hinge, pre-pilates   Person(s) Educated  Patient   Methods Explanation;Demonstration;Handout   Comprehension Verbalized understanding;Need further instruction          PT Short Term Goals - 01/16/15 1328    PT SHORT TERM GOAL #1   Title Pt will be I with Initial HEP   Status On-going   PT SHORT TERM GOAL #2   Title Pt will report less pain with ADLs, housework and reduction of m spasms   Status On-going           PT Long Term Goals - 01/16/15 1328    PT LONG TERM GOAL #1   Title Pt will be I with concepts of posture, lifting and core to prevent re-injury.    Status On-going   PT LONG TERM GOAL #2   Title Pt will report no pain with transitions, ADLs and standing for housework, consistently   Status On-going   PT LONG TERM GOAL #3   Status On-going               Plan - 01/16/15 1326    Clinical Impression Statement Patient reports no pain today, was sore  after eval.  Understands concepts of lifting but needs cues to execute.  Has good core HEP.     PT Next Visit Plan review prepilates HEP and integrate core into lifting   PT Home Exercise Plan added prepilates L stab   Consulted and Agree with Plan of Care Patient        Problem List Patient Active Problem List   Diagnosis Date Noted  . Anterior shoulder dislocation 07/25/2013    PAA,JENNIFER 01/16/2015, 1:32 PM  [facility] [address] [place], [place], [address] Phone: [phone]   Fax:  [phone]    Karie MainlandJennifer Paa, PT 01/16/2015 1:32 PM Phone: [phone] Fax: [phone]

## 2015-01-31 ENCOUNTER — Ambulatory Visit: Payer: PRIVATE HEALTH INSURANCE | Admitting: Physical Therapy

## 2015-01-31 DIAGNOSIS — M545 Low back pain, unspecified: Secondary | ICD-10-CM

## 2015-01-31 DIAGNOSIS — M62830 Muscle spasm of back: Secondary | ICD-10-CM

## 2015-01-31 NOTE — Patient Instructions (Signed)
.  Resisted Horizontal Abduction: Bilateral   Sit or stand, tubing in both hands, arms out in front. Keeping arms straight, pinch shoulder blades together and stretch arms out. Repeat __10-20__ times per set. Do __1-2__ sets per session. Do __3-5__ sessions per week  http://orth.exer.us/969   Copyright  VHI. All rights reserved.  Flexibility: Corner Stretch   Standing in corner with hands just above shoulder level and feet __12__ inches from corner, (OR IN A DOORWAY) lean forward until a comfortable stretch is felt across chest. Hold __20-30__ seconds. Repeat ___3_ times per set. Do __1__ sets per session. Do _2___ sessions per day.  http://orth.exer.us/343   Copyright  VHI. All rights reserved.

## 2015-01-31 NOTE — Therapy (Signed)
[place], [place], [address] Phone: [phone]   Fax:  [phone]  Physical Therapy Treatment  Patient Details  Name: Daisy Turner MRN: [id number] Date of Birth: 1970/11/25 Referring Provider:  Mcarthur Rossetti*  Encounter Date: 01/31/2015      PT End of Session - 01/31/15 0838    Visit Number 3   PT Start Time 0805      Past Medical History  Diagnosis Date  . Hypertension   . Obesity   . Hiatal hernia   . Diabetes mellitus without complication     Past Surgical History  Procedure Laterality Date  . Appendectomy      There were no vitals filed for this visit.  Visit Diagnosis:  Nonspecific pain in the lumbar region  Morbid obesity  Muscle spasm of back      Subjective Assessment - 01/31/15 0806    Subjective Back from camping, she did fine.  Was able to sleep on air mattress and in the car without increased back pain (mild stiffness)   Currently in Pain? No/denies                         [facility] Adult PT Treatment/Exercise - 01/31/15 0810    Lumbar Exercises: Stretches   Pelvic Tilt 5 reps   Pelvic Tilt Limitations x 10 reps   Lumbar Exercises: Standing   Wall Slides 10 reps;5 seconds   Other Standing Lumbar Exercises horiz abd with red band 2 sets 1 set done in a mini squat position   Other Standing Lumbar Exercises corner stretch for posture 3 x 30 sec   Lumbar Exercises: Supine   Ab Set 15 reps   Clam 15 reps   Clam Limitations bilat., unilateral   Heel Slides 15 reps   Heel Slides Limitations pain with knee motion    Bent Knee Raise 10 reps   Straight Leg Raise 10 reps   Straight Leg Raises Limitations pain in Rt. knee min       NuStep 5 min L 4 for strength and endurance, no pain post session.           PT Education - 01/31/15 [phone]    Education provided Yes   Education Details HEP, posture   Person(s) Educated Patient   Methods  Explanation;Demonstration;Handout   Comprehension Verbalized understanding;Returned demonstration          PT Short Term Goals - 01/31/15 0839    PT SHORT TERM GOAL #1   Title Pt will be I with Initial HEP   Status Achieved   PT SHORT TERM GOAL #2   Title Pt will report less pain with ADLs, housework and reduction of m spasms   Status Achieved           PT Long Term Goals - 01/31/15 0839    PT LONG TERM GOAL #1   Title Pt will be I with concepts of posture, lifting and core to prevent re-injury.    Status Achieved   PT LONG TERM GOAL #2   Title Pt will report no pain with transitions, ADLs and standing for housework, consistently   Status Achieved   PT LONG TERM GOAL #3   Title Pt will demo FOTO <40% limited    Status Achieved               Plan - 01/31/15 0838    Clinical Impression Statement Patient will need  to be DC today, financial reasons.  SHe has a good home program and has not had any major recurrences of back pain.    PT Next Visit Plan NA   PT Home Exercise Plan added prepilates L stab and upper body   Consulted and Agree with Plan of Care Patient        Problem List Patient Active Problem List   Diagnosis Date Noted  . Anterior shoulder dislocation 07/25/2013    Joziyah Roblero 01/31/2015, 8:48 AM  [facility] [address] [place], [place], [address] Phone: [phone]   Fax:  [phone]   PHYSICAL THERAPY DISCHARGE SUMMARY  Visits from Start of Care: 3  Current functional level related to goals / functional outcomes: See goals   Remaining deficits: Core strength, mild knee pain   Education / Equipment: HEP and posture Plan: Patient agrees to discharge.  Patient goals were met. Patient is being discharged due to meeting the stated rehab goals.  ?????   Financial reasons.  Raeford Razor, PT 01/31/2015 8:49 AM Phone: [phone] Fax: [phone]

## 2015-02-07 ENCOUNTER — Ambulatory Visit: Payer: PRIVATE HEALTH INSURANCE | Admitting: Physical Therapy

## 2015-02-14 ENCOUNTER — Encounter: Payer: PRIVATE HEALTH INSURANCE | Admitting: Physical Therapy

## 2016-01-23 ENCOUNTER — Other Ambulatory Visit: Payer: Self-pay | Admitting: Obstetrics & Gynecology

## 2016-01-23 DIAGNOSIS — N63 Unspecified lump in unspecified breast: Secondary | ICD-10-CM

## 2016-01-30 ENCOUNTER — Other Ambulatory Visit: Payer: PRIVATE HEALTH INSURANCE

## 2016-02-01 ENCOUNTER — Ambulatory Visit
Admission: RE | Admit: 2016-02-01 | Discharge: 2016-02-01 | Disposition: A | Discharge status: Home / Self Care | Payer: PRIVATE HEALTH INSURANCE | Source: Ambulatory Visit | Attending: Obstetrics & Gynecology | Admitting: Obstetrics & Gynecology

## 2016-02-01 DIAGNOSIS — N63 Unspecified lump in unspecified breast: Secondary | ICD-10-CM

## 2016-05-21 ENCOUNTER — Emergency Department (HOSPITAL_COMMUNITY): Payer: Managed Care, Other (non HMO)

## 2016-05-21 ENCOUNTER — Emergency Department (HOSPITAL_COMMUNITY)
Admission: EM | Admit: 2016-05-21 | Discharge: 2016-05-21 | Disposition: A | Discharge status: Home / Self Care | Payer: Managed Care, Other (non HMO) | Attending: Emergency Medicine | Admitting: Emergency Medicine

## 2016-05-21 ENCOUNTER — Encounter (HOSPITAL_COMMUNITY): Payer: Self-pay

## 2016-05-21 DIAGNOSIS — Z87891 Personal history of nicotine dependence: Secondary | ICD-10-CM | POA: Insufficient documentation

## 2016-05-21 DIAGNOSIS — E119 Type 2 diabetes mellitus without complications: Secondary | ICD-10-CM | POA: Insufficient documentation

## 2016-05-21 DIAGNOSIS — I10 Essential (primary) hypertension: Secondary | ICD-10-CM | POA: Diagnosis not present

## 2016-05-21 DIAGNOSIS — R079 Chest pain, unspecified: Secondary | ICD-10-CM | POA: Diagnosis present

## 2016-05-21 LAB — CBC
HEMATOCRIT: 39.1 % (ref 36.0–46.0)
Hemoglobin: 12.6 g/dL (ref 12.0–15.0)
MCH: 27.2 pg (ref 26.0–34.0)
MCHC: 32.2 g/dL (ref 30.0–36.0)
MCV: 84.4 fL (ref 78.0–100.0)
PLATELETS: 341 10*3/uL (ref 150–400)
RBC: 4.63 MIL/uL (ref 3.87–5.11)
RDW: 14.8 % (ref 11.5–15.5)
WBC: 9.4 10*3/uL (ref 4.0–10.5)

## 2016-05-21 LAB — BASIC METABOLIC PANEL
ANION GAP: 11 (ref 5–15)
BUN: 10 mg/dL (ref 6–20)
CHLORIDE: 101 mmol/L (ref 101–111)
CO2: 24 mmol/L (ref 22–32)
CREATININE: 0.65 mg/dL (ref 0.44–1.00)
Calcium: 9.7 mg/dL (ref 8.9–10.3)
GFR calc non Af Amer: >60 mL/min (ref >60)
Glucose, Bld: 169 mg/dL — ABNORMAL HIGH (ref 65–99)
Potassium: 4.7 mmol/L (ref 3.5–5.1)
Sodium: 136 mmol/L (ref 135–145)

## 2016-05-21 LAB — I-STAT TROPONIN, ED: Troponin i, poc: 0 ng/mL (ref 0.00–0.08)

## 2016-05-21 NOTE — Discharge Instructions (Signed)
Use Tylenol as needed for chest pain.  Follow-up with your doctor if not improving in the next few days or if you develop additional symptoms.

## 2016-05-21 NOTE — ED Notes (Signed)
Esignature not available. 

## 2016-05-21 NOTE — ED Triage Notes (Addendum)
Pt c/o left-sided CP starting around 0730 this morning. Pt reports intermittent CP "from time to time" but became concerned when CP radiated to back, left side of neck and left arm. Denies shortness of breath/diaphoresis/weakness/n/v. A&O x 4, VSS, NAD. Pt took 2 81mg  aspirin at home this morning.

## 2016-05-21 NOTE — ED Provider Notes (Signed)
[facility]-EMERGENCY DEPT Provider Note   CSN: [id number] Arrival date & time: 05/21/16  1157     History   Chief Complaint Chief Complaint  Patient presents with  . Chest Pain    HPI Daisy Turner is a 45 y.o. female.  She presents for evaluation of chest pain which started around 7:00 this morning and later radiated to her left neck and left arm. At the time of evaluation, she states the pain is improved and now only 4 out of 10. The pain is dull in character. There is no associated diaphoresis, nausea, vomiting, shortness of breath, cough, weakness or dizziness. No prior similar problems. She was started on Januvia last week, for high blood sugar. There are no other known modifying factors.  HPI  Past Medical History:  Diagnosis Date  . Diabetes mellitus without complication (HCC)   . Hiatal hernia   . Hypertension   . Obesity     Patient Active Problem List   Diagnosis Date Noted  . Anterior shoulder dislocation 07/25/2013    Past Surgical History:  Procedure Laterality Date  . APPENDECTOMY      OB History    No data available       Home Medications    Prior to Admission medications   Medication Sig Start Date End Date Taking? Authorizing Provider  cyclobenzaprine (FLEXERIL) 10 MG tablet Take 1 tablet (10 mg total) by mouth 2 (two) times daily as needed for muscle spasms. 12/23/14   Oswaldo Conroy, PA-C  Lansoprazole (PREVACID PO) Take 1 capsule by mouth daily as needed (reflux).    Historical Provider, MD  lisinopril (PRINIVIL,ZESTRIL) 10 MG tablet Take 10 mg by mouth daily.    Historical Provider, MD  oxyCODONE-acetaminophen (PERCOCET) 5-325 MG per tablet Take 1 tablet by mouth every 6 (six) hours as needed for moderate pain. Patient not taking: Reported on 01/13/2015 07/25/13   Purvis Sheffield, MD    Family History History reviewed. No pertinent family history.  Social History Social History  Substance Use Topics  . Smoking status: Former Smoker    Quit  date: 09/25/2012  . Smokeless tobacco: Never Used  . Alcohol use No     Allergies   Review of patient's allergies indicates no known allergies.   Review of Systems Review of Systems  All other systems reviewed and are negative.    Physical Exam Updated Vital Signs BP 145/66   Pulse 100   Temp 97.9 F (36.6 C) (Oral)   Resp 19   Ht 5\' 8"  (1.727 m)   Wt (!) 350 lb (158.8 kg)   LMP 05/13/2016 (Exact Date)   SpO2 93%   BMI 53.22 kg/m   Physical Exam  Constitutional: She is oriented to person, place, and time. She appears well-developed.  Morbidly obese  HENT:  Head: Normocephalic and atraumatic.  Eyes: Conjunctivae and EOM are normal. Pupils are equal, round, and reactive to light.  Neck: Normal range of motion and phonation normal. Neck supple.  Cardiovascular: Normal rate and regular rhythm.   Pulmonary/Chest: Effort normal and breath sounds normal. No respiratory distress. She has no wheezes. She exhibits no tenderness.  Abdominal: Soft. She exhibits no distension. There is no tenderness. There is no guarding.  Musculoskeletal: Normal range of motion. She exhibits no edema or deformity.  Neurological: She is alert and oriented to person, place, and time. She exhibits normal muscle tone.  Skin: Skin is warm and dry.  Psychiatric: She has a normal mood and affect. Her  behavior is normal. Judgment and thought content normal.  Nursing note and vitals reviewed.    ED Treatments / Results  Labs (all labs ordered are listed, but only abnormal results are displayed) Labs Reviewed  BASIC METABOLIC PANEL - Abnormal; Notable for the following:       Result Value   Glucose, Bld 169 (*)    All other components within normal limits  CBC  I-STAT TROPOININ, ED    EKG  EKG Interpretation  Date/Time:  Tuesday May 21 2016 12:01:03 EDT Ventricular Rate:  101 PR Interval:  156 QRS Duration: 88 QT Interval:  332 QTC Calculation: 430 R Axis:   62 Text Interpretation:   Sinus tachycardia Otherwise normal ECG Since last tracing rate faster Confirmed by Effie ShyWENTZ  MD, Paytin Ramakrishnan [phone]([id number]) on 05/21/2016 5:16:36 PM       Radiology Dg Chest 2 View  Result Date: 05/21/2016 CLINICAL DATA:  Chest pain since this morning. Radiates to left arm and neck. EXAM: CHEST  2 VIEW COMPARISON:  08/02/2005 FINDINGS: Heart and mediastinal contours are within normal limits. No focal opacities or effusions. No acute bony abnormality. IMPRESSION: No active cardiopulmonary disease. Electronically Signed   By: Charlett NoseKevin  Dover M.D.   On: 05/21/2016 12:38    Procedures Procedures (including critical care time)  Medications Ordered in ED Medications - No data to display   Initial Impression / Assessment and Plan / ED Course  I have reviewed the triage vital signs and the nursing notes.  Pertinent labs & imaging results that were available during my care of the patient were reviewed by me and considered in my medical decision making (see chart for details).  Clinical Course    Medications - No data to display  Patient Vitals for the past 24 hrs:  BP Temp Temp src Pulse Resp SpO2 Height Weight  05/21/16 1745 145/66 - - 100 19 93 % - -  05/21/16 1504 162/69 97.9 F (36.6 C) Oral 91 18 97 % - -  05/21/16 1236 157/75 98.3 F (36.8 C) Oral 93 17 96 % - -  05/21/16 1202 - - - - - - 5\' 8"  (1.727 m) (!) 350 lb (158.8 kg)    6:04 PM Reevaluation with update and discussion. After initial assessment and treatment, an updated evaluation reveals No change in clinical status. Findings discussed with patient, all questions answered. Warrene Kapfer L    Final Clinical Impressions(s) / ED Diagnoses   Final diagnoses:  Nonspecific chest pain    Nonspecific chest pain with reassuring evaluation. PERC negative. Doubt acute vascular disorder. No evidence for infectious process.  Nursing Notes Reviewed/ Care Coordinated Applicable Imaging Reviewed Interpretation of Laboratory Data incorporated  into ED treatment  The patient appears reasonably screened and/or stabilized for discharge and I doubt any other medical condition or other [facility] requiring further screening, evaluation, or treatment in the ED at this time prior to discharge.  Plan: Home Medications- Tylenol for pain; Home Treatments- rest; return here if the recommended treatment, does not improve the symptoms; Recommended follow up- PCP prn   New Prescriptions New Prescriptions   No medications on file     Mancel BaleElliott Vincent Streater, MD 05/21/16 1805

## 2017-06-18 ENCOUNTER — Encounter: Payer: Self-pay | Admitting: Internal Medicine

## 2017-06-18 ENCOUNTER — Ambulatory Visit: Payer: Managed Care, Other (non HMO) | Admitting: Internal Medicine

## 2017-06-18 DIAGNOSIS — G4733 Obstructive sleep apnea (adult) (pediatric): Secondary | ICD-10-CM

## 2017-06-18 NOTE — Progress Notes (Signed)
06/18/17-46 year old female former smoker for sleep evaluation. Sleep Consult; Dr Mady GemmaKristen Kaplan, pt never had sleep study. Pt falls asleep during the day and is up and down alot through night. Husband notes snoring at night; gasping for air.  Medical problem list includes DM 2, HBP, obesity. Epworth Score 21 She describes sleep quality as fragmented with difficulty maintaining sleep "up and down all night, falling asleep during the day".  No sleep medicines.  One cup of morning coffee.  Sleep is affected by occasional heartburn, bathroom trips and occasional cramps but little kicking or parasomnia. She denies problems with nose, lungs, heart or thyroid.  Significant weight gain in the last few years.  Prior to Admission medications   Medication Sig Start Date End Date Taking? Authorizing Provider  atorvastatin (LIPITOR) 10 MG tablet TK 1 T PO D 10/28/16  Yes [provider]  Lansoprazole (PREVACID PO) Take 1 capsule by mouth daily as needed (reflux).   Yes [provider]  liraglutide (VICTOZA) 18 MG/3ML SOPN Inject 0.6 mg into the skin. 05/13/17  Yes [provider]  metFORMIN (GLUCOPHAGE-XR) 500 MG 24 hr tablet Take 2 pills with evening meal 06/06/16  Yes [provider]  metoprolol tartrate (LOPRESSOR) 25 MG tablet Take 25 mg by mouth.   Yes [provider]  nitroGLYCERIN (NITROSTAT) 0.4 MG SL tablet Place 0.4 mg under the tongue.   Yes [provider]  Norethindrone-Ethinyl Estradiol-Fe Biphas (LO LOESTRIN FE) 1 MG-10 MCG / 10 MCG tablet  12/02/16  Yes [provider]  sitaGLIPtin (JANUVIA) 100 MG tablet Take 100 mg by mouth. 01/14/17  Yes [provider]  Vitamin D, Ergocalciferol, (DRISDOL) 50000 units CAPS capsule Take by mouth. 05/18/17  Yes [provider]   Past Medical History:  Diagnosis Date  . Diabetes mellitus without complication (HCC)   . Hiatal hernia   . Hypertension   . Obesity    Past Surgical  History:  Procedure Laterality Date  . APPENDECTOMY     History reviewed. No pertinent family history. Social History   Socioeconomic History  . Marital status: Married    Spouse name: Not on file  . Number of children: Not on file  . Years of education: Not on file  . Highest education level: Not on file  Social Needs  . Financial resource strain: Not on file  . Food insecurity - worry: Not on file  . Food insecurity - inability: Not on file  . Transportation needs - medical: Not on file  . Transportation needs - non-medical: Not on file  Occupational History  . Not on file  Tobacco Use  . Smoking status: Former Smoker    Last attempt to quit: 09/25/2012    Years since quitting: 4.7  . Smokeless tobacco: Never Used  Substance and Sexual Activity  . Alcohol use: No  . Drug use: No  . Sexual activity: Not on file  Other Topics Concern  . Not on file  Social History Narrative  . Not on file   ROS-see HPI   + = positive Constitutional:    weight loss, night sweats, fevers, chills, fatigue, lassitude. HEENT:    headaches, difficulty swallowing, tooth/dental problems, sore throat,       sneezing, +itching, ear ache, nasal congestion, post nasal drip, snoring CV:    +chest pain, orthopnea, PND, swelling in lower extremities, anasarca,  dizziness, palpitations Resp:   +shortness of breath with exertion or at rest.                productive cough,   non-productive cough, coughing up of blood.              change in color of mucus.  wheezing.   Skin:    +rash or lesions. GI:  + heartburn, indigestion, abdominal pain, nausea, vomiting, diarrhea,                 change in bowel habits, loss of appetite GU: dysuria, change in color of urine, no urgency or frequency.   flank pain. MS:   +joint pain, stiffness, decreased range of motion, back pain. Neuro-     nothing unusual Psych:  change in mood or affect.  depression or anxiety.   memory  loss.  OBJ- Physical Exam General- Alert, Oriented, Affect-appropriate, Distress- none acute, morbidly obese Skin- rash + rosacea, lesions- none, excoriation- none Lymphadenopathy- none Head- atraumatic            Eyes- Gross vision intact, PERRLA, conjunctivae and secretions clear            Ears- Hearing, canals-normal            Nose- Clear, no-Septal dev, mucus, polyps, erosion, perforation             Throat- Mallampati III , mucosa clear , drainage- none, tonsils - 2+ Neck- flexible , trachea midline, no stridor , thyroid nl, carotid no bruit Chest - symmetrical excursion , unlabored           Heart/CV- RRR , no murmur , no gallop  , no rub, nl s1 s2                           - JVD- none , edema- none, stasis changes- none, varices- none           Lung- clear to P&A, wheeze- none, cough- none , dullness-none, rub- none           Chest wall-  Abd-  Br/ Gen/ Rectal- Not done, not indicated Extrem- cyanosis- none, clubbing, none, atrophy- none, strength- nl Neuro- grossly intact to observation

## 2017-06-18 NOTE — Patient Instructions (Signed)
Order- schedule unattended home sleep test    Dx OSA  Please call me about 2 weeks after your study is done, to request results and recommendation. If appropriate, we may be able to go ahead and start treatment before you return.  Please call anytime if you have questions.

## 2017-06-18 NOTE — Assessment & Plan Note (Signed)
Multiple medical problems will be improved by returning to normal body weight.  She may be a candidate for bariatric consideration.

## 2017-06-18 NOTE — Addendum Note (Signed)
Addended by: Ronny BaconWELCHEL, Taheem Fricke C on: 06/18/2017 03:58 PM   Modules accepted: Orders

## 2017-06-18 NOTE — Assessment & Plan Note (Signed)
Tentative diagnosis, high probability based on history and exam.  She has gained 40 pounds in the last 2 years and we discussed the probability that weight loss would help.  Basics of sleep hygiene, medical concerns of obstructive sleep apnea and treatment options discussed. Plan-schedule sleep study.  We may be able to start treatment before she returns.

## 2017-07-08 DIAGNOSIS — G4733 Obstructive sleep apnea (adult) (pediatric): Secondary | ICD-10-CM | POA: Diagnosis not present

## 2017-07-09 DIAGNOSIS — G4733 Obstructive sleep apnea (adult) (pediatric): Secondary | ICD-10-CM | POA: Diagnosis not present

## 2017-07-11 ENCOUNTER — Other Ambulatory Visit: Payer: Self-pay | Admitting: *Deleted

## 2017-07-11 DIAGNOSIS — G4733 Obstructive sleep apnea (adult) (pediatric): Secondary | ICD-10-CM

## 2017-07-16 ENCOUNTER — Institutional Professional Consult (permissible substitution): Payer: PRIVATE HEALTH INSURANCE | Admitting: Pulmonary Disease

## 2017-07-23 ENCOUNTER — Telehealth: Payer: Self-pay | Admitting: Internal Medicine

## 2017-07-23 DIAGNOSIS — G4733 Obstructive sleep apnea (adult) (pediatric): Secondary | ICD-10-CM

## 2017-07-23 NOTE — Telephone Encounter (Signed)
CY please review sleep study results.

## 2017-07-24 NOTE — Telephone Encounter (Signed)
ATC pt, no answer. Left message for pt to call back.  

## 2017-07-24 NOTE — Telephone Encounter (Signed)
Her home sleep test confirmed severe obstructive sleep apnea, averaging 35 apneas per hour with drops in blood oxygen level.  I recommend we start CPAP as discussed in the office. Plan- Order- new DME, new CPAP auto 5-20, mask of choice, humidifier, supplies, AirView    Dx OSA  Please make sure she has a return office visit between 31 and 90 days after she gets her machine, for follow-up required by insurance.

## 2017-07-25 NOTE — Telephone Encounter (Signed)
Pt returned call and can be reached now at [phone]. tr

## 2017-07-25 NOTE — Telephone Encounter (Signed)
Spoke with patient. She is aware of results. Wishes to proceed with CPAP machine. Advised patient to call office once she has machine so that we can schedule her for a follow up. She verbalized understanding. Nothing else needed at time of call.

## 2017-07-25 NOTE — Telephone Encounter (Signed)
lmtcb X1 for pt with daughter to call back.

## 2017-08-19 ENCOUNTER — Other Ambulatory Visit: Payer: Self-pay | Admitting: Family Medicine

## 2017-08-19 DIAGNOSIS — Z1231 Encounter for screening mammogram for malignant neoplasm of breast: Secondary | ICD-10-CM

## 2017-08-21 ENCOUNTER — Ambulatory Visit
Admission: RE | Admit: 2017-08-21 | Discharge: 2017-08-21 | Disposition: A | Discharge status: Home / Self Care | Payer: Managed Care, Other (non HMO) | Source: Ambulatory Visit | Attending: Family Medicine | Admitting: Family Medicine

## 2017-08-21 DIAGNOSIS — Z1231 Encounter for screening mammogram for malignant neoplasm of breast: Secondary | ICD-10-CM

## 2017-09-17 ENCOUNTER — Encounter: Payer: Self-pay | Admitting: Internal Medicine

## 2017-09-19 ENCOUNTER — Ambulatory Visit: Payer: Managed Care, Other (non HMO) | Admitting: Internal Medicine

## 2017-09-19 ENCOUNTER — Encounter: Payer: Self-pay | Admitting: Internal Medicine

## 2017-09-19 ENCOUNTER — Institutional Professional Consult (permissible substitution): Payer: PRIVATE HEALTH INSURANCE | Admitting: Internal Medicine

## 2017-09-19 VITALS — BP 138/80 | HR 96 | Ht 68.0 in | Wt 320.4 lb

## 2017-09-19 DIAGNOSIS — G4733 Obstructive sleep apnea (adult) (pediatric): Secondary | ICD-10-CM | POA: Diagnosis not present

## 2017-09-19 NOTE — Assessment & Plan Note (Signed)
Fair start with CPAP.  I reviewed compliance download with her and discussed comfort measures.  We may need to work on mask fit.  She is willing to try a chinstrap initially to see if that will maintain mandible position and prevent breaking mask seal.  Fortunately mask has not seem to aggravate her rosacea so far.  If she can tolerate other chinstrap then she may be a candidate for a different mask design.

## 2017-09-19 NOTE — Patient Instructions (Signed)
Order- DME [facility]- please provide chin strap to help with mouth closure. Continue CPAP auto 5-20, mask of choice, humidifier, supplies, AirView   Dx OSA  We will watch with you for ways to help you be more comfortable with CPAP. If you notice something that you would like to change, please let us know.

## 2017-09-19 NOTE — Assessment & Plan Note (Signed)
Important to continue encouraging weight loss.  She might eventually be interested in bariatric referral for discussion and support.

## 2017-09-19 NOTE — Progress Notes (Signed)
HPI female former smoker followed for OSA, complicated by DM 2, HBP, obesity HST 07/08/17-AHI 35.1/hour, desaturation to 78%, body weight 325 pounds  -------------------------------------------------------------------------------- 06/18/17-47 year old female former smoker for sleep evaluation. Sleep Consult; Dr Mady GemmaKristen Kaplan, pt never had sleep study. Pt falls asleep during the day and is up and down alot through night. Husband notes snoring at night; gasping for air.  Medical problem list includes DM 2, HBP, obesity. Epworth Score 21 She describes sleep quality as fragmented with difficulty maintaining sleep "up and down all night, falling asleep during the day".  No sleep medicines.  One cup of morning coffee.  Sleep is affected by occasional heartburn, bathroom trips and occasional cramps but little kicking or parasomnia. She denies problems with nose, lungs, heart or thyroid.  Significant weight gain in the last few years.  09/20/16-47 year old female former smoker followed for OSA, complicated by DM 2, HBP, obesity HST 07/08/17-AHI 35.1/hour, desaturation to 78%, body weight 325 pounds CPAP auto 5-20/Advanced started 07/25/17 ----OSA; DME: [facility] Pt had been wearing CPAP except past week due to cold. DL attached. Admits to sleeping better with CPAP but still struggling to "get used to it".  Takes it off early.  Download shows use every night but meeting compliance goals only 47% so far.  Excellent control AHI 0.1/hour.  Full facemask after trying nasal pillows.  Says she is claustrophobic in the mouth breather.  Recent upper respiratory infection this week, interfering with mask comfort temporarily but she is getting better with no fever or purulent sputum.  We discussed comfort measures.  ROS-see HPI   + = positive Constitutional:    weight loss, night sweats, fevers, chills, fatigue, lassitude. HEENT:    headaches, difficulty swallowing, tooth/dental problems, sore throat,       sneezing,  +itching, ear ache, nasal congestion, post nasal drip, snoring CV:    +chest pain, orthopnea, PND, swelling in lower extremities, anasarca,                                  dizziness, palpitations Resp:   +shortness of breath with exertion or at rest.                productive cough,   non-productive cough, coughing up of blood.              change in color of mucus.  wheezing.   Skin:    +rash or lesions. GI:  + heartburn, indigestion, abdominal pain, nausea, vomiting, diarrhea,                 change in bowel habits, loss of appetite GU: dysuria, change in color of urine, no urgency or frequency.   flank pain. MS:   +joint pain, stiffness, decreased range of motion, back pain. Neuro-     nothing unusual Psych:  change in mood or affect.  depression or anxiety.   memory loss.  OBJ- Physical Exam General- Alert, Oriented, Affect-appropriate, Distress- none acute, +morbidly obese Skin- rash + rosacea,  Lymphadenopathy- none Head- atraumatic            Eyes- Gross vision intact, PERRLA, conjunctivae and secretions clear            Ears- Hearing, canals-normal            Nose- Clear, no-Septal dev, mucus, polyps, erosion, perforation             Throat- Mallampati  III , mucosa clear , drainage- none, tonsils - 2+ Neck- flexible , trachea midline, no stridor , thyroid nl, carotid no bruit Chest - symmetrical excursion , unlabored           Heart/CV- RRR , no murmur , no gallop  , no rub, nl s1 s2                           - JVD- none , edema- none, stasis changes- none, varices- none           Lung- clear to P&A, wheeze- none, cough+ light , dullness-none, rub- none           Chest wall-  Abd-  Br/ Gen/ Rectal- Not done, not indicated Extrem- cyanosis- none, clubbing, none, atrophy- none, strength- nl Neuro- grossly intact to observation

## 2017-12-17 ENCOUNTER — Encounter: Payer: Self-pay | Admitting: Internal Medicine

## 2017-12-18 ENCOUNTER — Ambulatory Visit (INDEPENDENT_AMBULATORY_CARE_PROVIDER_SITE_OTHER): Payer: 59 | Admitting: Internal Medicine

## 2017-12-18 ENCOUNTER — Encounter: Payer: Self-pay | Admitting: Internal Medicine

## 2017-12-18 VITALS — BP 128/74 | HR 87 | Ht 68.0 in | Wt 325.4 lb

## 2017-12-18 DIAGNOSIS — G4762 Sleep related leg cramps: Secondary | ICD-10-CM | POA: Diagnosis not present

## 2017-12-18 DIAGNOSIS — G4733 Obstructive sleep apnea (adult) (pediatric): Secondary | ICD-10-CM

## 2017-12-18 NOTE — Progress Notes (Signed)
HPI female former smoker followed for OSA, complicated by DM 2, HBP, obesity HST 07/08/17-AHI 35.1/hour, desaturation to 78%, body weight 325 pounds  --------------------------------------------------------------------------------  09/20/16-47 year old female former smoker followed for OSA, complicated by DM 2, HBP, obesity HST 07/08/17-AHI 35.1/hour, desaturation to 78%, body weight 325 pounds CPAP auto 5-20/Advanced started 07/25/17 ----OSA; DME: [facility] Pt had been wearing CPAP except past week due to cold. DL attached. Admits to sleeping better with CPAP but still struggling to "get used to it".  Takes it off early.  Download shows use every night but meeting compliance goals only 47% so far.  Excellent control AHI 0.1/hour.  Full facemask after trying nasal pillows.  Says she is claustrophobic in the mouth breather.  Recent upper respiratory infection this week, interfering with mask comfort temporarily but she is getting better with no fever or purulent sputum.  We discussed comfort measures.  12/18/2017- 47 year old female former smoker followed for OSA, complicated by DM 2, HBP, obesity CPAP auto 5-20/Advanced started 07/25/17 ----OSA; DME: [facility]. Pt wears CPAP nightly roughly around 4 hours and pressures work well; no new supplies needed at this time. Pt has request in for new mask. DL attached.  Weight today 325 pounds Says she is waking 4 times a night with leg cramps. Download compliance 53%, AHI 0.6/hour.  She is very comfortable with the pressure and tries CPAP most nights but often puts it on late.  We reviewed the download and discussed compliance goals.  Straps are bothering her rosacea and she would like to try changing to a nasal pillows mask with different strap.  ROS-see HPI   + = positive Constitutional:    weight loss, night sweats, fevers, chills, fatigue, lassitude. HEENT:    headaches, difficulty swallowing, tooth/dental problems, sore throat,       sneezing, +itching, ear ache,  nasal congestion, post nasal drip, snoring CV:    +chest pain, orthopnea, PND, swelling in lower extremities, anasarca,                                                   dizziness, palpitations Resp:   +shortness of breath with exertion or at rest.                productive cough,   non-productive cough, coughing up of blood.              change in color of mucus.  wheezing.   Skin:    +rash or lesions. GI:  + heartburn, indigestion, abdominal pain, nausea, vomiting, diarrhea,                 change in bowel habits, loss of appetite GU: dysuria, change in color of urine, no urgency or frequency.   flank pain. MS:   +joint pain, stiffness, decreased range of motion, back pain. Neuro-     + leg cramps Psych:  change in mood or affect.  depression or anxiety.   memory loss.  OBJ- Physical Exam General- Alert, Oriented, Affect-appropriate, Distress- none acute, +morbidly obese Skin- rash + rosacea,  Lymphadenopathy- none Head- atraumatic            Eyes- Gross vision intact, PERRLA, conjunctivae and secretions clear            Ears- Hearing, canals-normal  Nose- Clear, no-Septal dev, mucus, polyps, erosion, perforation             Throat- Mallampati III , mucosa clear , drainage- none, tonsils - 2+ Neck- flexible , trachea midline, no stridor , thyroid nl, carotid no bruit Chest - symmetrical excursion , unlabored           Heart/CV- RRR , no murmur , no gallop  , no rub, nl s1 s2                           - JVD- none , edema- none, stasis changes- none, varices- none           Lung- clear to P&A, wheeze- none, cough-none, dullness-none, rub- none           Chest wall-  Abd-  Br/ Gen/ Rectal- Not done, not indicated Extrem- cyanosis- none, clubbing, none, atrophy- none, strength- nl Neuro- grossly intact to observation

## 2017-12-18 NOTE — Assessment & Plan Note (Signed)
She recognizes that she benefits from CPAP, sleeping better.  We reviewed download goals and comfort measures.  She asks for help changing mask so the straps do not bother her rosacea. Plan-refit mask to try nasal pillows as discussed.

## 2017-12-18 NOTE — Assessment & Plan Note (Signed)
Her leg cramps may reflect her obesity, circulatory issues, her weight or electrolyte problems.  She says she is not drinking enough water.  We discussed electrolyte replacement/Gatorade, trial of quinine using tonic water.  If this continues she needs to discuss with her PCP.

## 2017-12-18 NOTE — Patient Instructions (Signed)
Order- DME [facility]- Please help refit mask- try nasal pillows, to help with skin rash. Continue CPAP auto 5-20, mask of choice, humidifier, supplies  Please call if we can help

## 2017-12-18 NOTE — Assessment & Plan Note (Signed)
She has not been successful at changing her lifestyle to permit significant weight loss.  Encouragement provided.

## 2018-06-22 ENCOUNTER — Ambulatory Visit: Payer: 59 | Admitting: Internal Medicine

## 2018-06-25 ENCOUNTER — Ambulatory Visit (INDEPENDENT_AMBULATORY_CARE_PROVIDER_SITE_OTHER): Payer: 59 | Admitting: Internal Medicine

## 2018-06-25 ENCOUNTER — Encounter: Payer: Self-pay | Admitting: Internal Medicine

## 2018-06-25 VITALS — BP 126/76 | HR 87 | Ht 68.0 in | Wt 334.0 lb

## 2018-06-25 DIAGNOSIS — G4733 Obstructive sleep apnea (adult) (pediatric): Secondary | ICD-10-CM

## 2018-06-25 NOTE — Progress Notes (Signed)
HPI female former smoker followed for OSA, complicated by DM 2, HBP, obesity HST 07/08/17-AHI 35.1/hour, desaturation to 78%, body weight 325 pounds  --------------------------------------------------------------------------------  12/18/2017- 47 year old female former smoker followed for OSA, complicated by DM 2, HBP, obesity CPAP auto 5-20/Advanced started 07/25/17 ----OSA; DME: [facility]. Pt wears CPAP nightly roughly around 4 hours and pressures work well; no new supplies needed at this time. Pt has request in for new mask. DL attached.  Weight today 325 pounds Says she is waking 4 times a night with leg cramps. Download compliance 53%, AHI 0.6/hour.  She is very comfortable with the pressure and tries CPAP most nights but often puts it on late.  We reviewed the download and discussed compliance goals.  Straps are bothering her rosacea and she would like to try changing to a nasal pillows mask with different strap.  06/25/2018- 47 year old female former smoker followed for OSA, complicated by DM 2, HBP, obesity CPAP auto 5-20/Advanced started 07/25/17 Download 70% compliance AHI 0.1/hour -----pt wearing cpap avg 4-5hr nightly- occ takes mask off during sleep. waking feeling well rested. DME:[facility] Body weight today 334 pounds He prefers full facemask.  Husband has reported back on if she takes it off in her sleep.  She will be without insurance at the end of the year and we discussed. Denies heart or lung problems.  ROS-see HPI   + = positive Constitutional:    weight loss, night sweats, fevers, chills, fatigue, lassitude. HEENT:    headaches, difficulty swallowing, tooth/dental problems, sore throat,       sneezing, itching, ear ache, nasal congestion, post nasal drip, snoring CV:    chest pain, orthopnea, PND, swelling in lower extremities, anasarca,                                                 dizziness, palpitations Resp:   +shortness of breath with exertion or at rest.    productive cough,   non-productive cough, coughing up of blood.              change in color of mucus.  wheezing.   Skin:    +rash or lesions. GI:  + heartburn, indigestion, abdominal pain, nausea, vomiting, diarrhea,                 change in bowel habits, loss of appetite GU: dysuria, change in color of urine, no urgency or frequency.   flank pain. MS:   +joint pain, stiffness, decreased range of motion, back pain. Neuro-     + leg cramps Psych:  change in mood or affect.  depression or anxiety.   memory loss.  OBJ- Physical Exam General- Alert, Oriented, Affect-appropriate, Distress- none acute, +morbidly obese Skin- rash + rosacea,  Lymphadenopathy- none Head- atraumatic            Eyes- Gross vision intact, PERRLA, conjunctivae and secretions clear            Ears- Hearing, canals-normal            Nose- Clear, no-Septal dev, mucus, polyps, erosion, perforation             Throat- Mallampati III , mucosa clear , drainage- none, tonsils - 2+ Neck- flexible , trachea midline, no stridor , thyroid nl, carotid no bruit Chest - symmetrical excursion , unlabored  Heart/CV- RRR , no murmur , no gallop  , no rub, nl s1 s2                           - JVD- none , edema- none, stasis changes- none, varices- none           Lung- clear to P&A, wheeze- none, cough-none, dullness-none, rub- none           Chest wall-  Abd-  Br/ Gen/ Rectal- Not done, not indicated Extrem- cyanosis- none, clubbing, none, atrophy- none, strength- nl Neuro- grossly intact to observation

## 2018-06-25 NOTE — Patient Instructions (Signed)
We can continue CPAP auto 5-20, mask of choice, humidifier supplies Airview  Please call if we can help

## 2018-09-01 NOTE — Assessment & Plan Note (Signed)
She has not been prepared to make the lifestyle changes necessary for meaningful weight loss.  Discussed availability of bariatric consultation.

## 2018-09-01 NOTE — Assessment & Plan Note (Signed)
She continues to benefit from CPAP with improved sleep and prevention of snoring.  Download confirms good compliance and excellent control. Plan-continue CPAP auto 5-20.

## 2019-06-28 ENCOUNTER — Ambulatory Visit: Payer: 59 | Admitting: Internal Medicine

## 2019-11-12 ENCOUNTER — Ambulatory Visit: Payer: Self-pay | Attending: Internal Medicine

## 2019-11-12 DIAGNOSIS — Z23 Encounter for immunization: Secondary | ICD-10-CM

## 2019-11-12 NOTE — Progress Notes (Signed)
   Covid-19 Vaccination Clinic  Name:  Alea Ryer    MRN: [id number] DOB: 09/01/1970  11/12/2019  Ms. Luscher was observed post Covid-19 immunization for 15 minutes without incident. She was provided with Vaccine Information Sheet and instruction to access the V-Safe system.   Ms. Silveira was instructed to call 911 with any severe reactions post vaccine: Marland Kitchen Difficulty breathing  . Swelling of face and throat  . A fast heartbeat  . A bad rash all over body  . Dizziness and weakness   Immunizations Administered    Name Date Dose VIS Date Route   Pfizer COVID-19 Vaccine 11/12/2019  2:05 PM 0.3 mL 07/23/2019 Intramuscular   Manufacturer: [employer], [employer]   Lot: [id number]   NDC: [address]-4

## 2019-12-03 ENCOUNTER — Other Ambulatory Visit: Payer: Self-pay | Admitting: Family Medicine

## 2019-12-03 DIAGNOSIS — Z1231 Encounter for screening mammogram for malignant neoplasm of breast: Secondary | ICD-10-CM

## 2019-12-06 ENCOUNTER — Ambulatory Visit: Payer: Self-pay | Attending: Internal Medicine

## 2019-12-06 DIAGNOSIS — Z23 Encounter for immunization: Secondary | ICD-10-CM

## 2019-12-06 NOTE — Progress Notes (Signed)
   Covid-19 Vaccination Clinic  Name:  Paola Aleshire    MRN: [id number] DOB: 10-Dec-1970  12/06/2019  Ms. Rief was observed post Covid-19 immunization for 15 minutes without incident. She was provided with Vaccine Information Sheet and instruction to access the V-Safe system.   Ms. Seif was instructed to call 911 with any severe reactions post vaccine: Marland Kitchen Difficulty breathing  . Swelling of face and throat  . A fast heartbeat  . A bad rash all over body  . Dizziness and weakness   Immunizations Administered    Name Date Dose VIS Date Route   Pfizer COVID-19 Vaccine 12/06/2019  3:57 PM 0.3 mL 10/06/2018 Intramuscular   Manufacturer: [employer], [employer]   Lot: [id number]   NDC: [address]-9

## 2020-01-17 ENCOUNTER — Ambulatory Visit
Admission: RE | Admit: 2020-01-17 | Discharge: 2020-01-17 | Disposition: A | Discharge status: Home / Self Care | Payer: BC Managed Care – PPO | Source: Ambulatory Visit | Attending: Family Medicine | Admitting: Family Medicine

## 2020-01-17 ENCOUNTER — Other Ambulatory Visit: Payer: Self-pay

## 2020-01-17 DIAGNOSIS — Z1231 Encounter for screening mammogram for malignant neoplasm of breast: Secondary | ICD-10-CM

## 2021-08-08 ENCOUNTER — Other Ambulatory Visit: Payer: Self-pay | Admitting: Obstetrics & Gynecology

## 2021-08-08 DIAGNOSIS — Z1231 Encounter for screening mammogram for malignant neoplasm of breast: Secondary | ICD-10-CM

## 2021-08-31 ENCOUNTER — Ambulatory Visit
Admission: RE | Admit: 2021-08-31 | Discharge: 2021-08-31 | Disposition: A | Discharge status: Home / Self Care | Payer: BC Managed Care – PPO | Source: Ambulatory Visit | Attending: Obstetrics & Gynecology | Admitting: Obstetrics & Gynecology

## 2021-08-31 DIAGNOSIS — Z1231 Encounter for screening mammogram for malignant neoplasm of breast: Secondary | ICD-10-CM

## 2022-09-03 IMAGING — MG MM DIGITAL SCREENING BILAT W/ TOMO AND CAD
8 series · 8 of 24 positions shown · non-contrast
Comparison: Previous exam(s).

CLINICAL DATA: Screening.

EXAM:
DIGITAL SCREENING BILATERAL MAMMOGRAM WITH TOMOSYNTHESIS AND CAD
TECHNIQUE: Bilateral screening digital craniocaudal and mediolateral oblique
mammograms were obtained. Bilateral screening digital breast
tomosynthesis was performed. The images were evaluated with
computer-aided detection.

[R CC synth-2D]
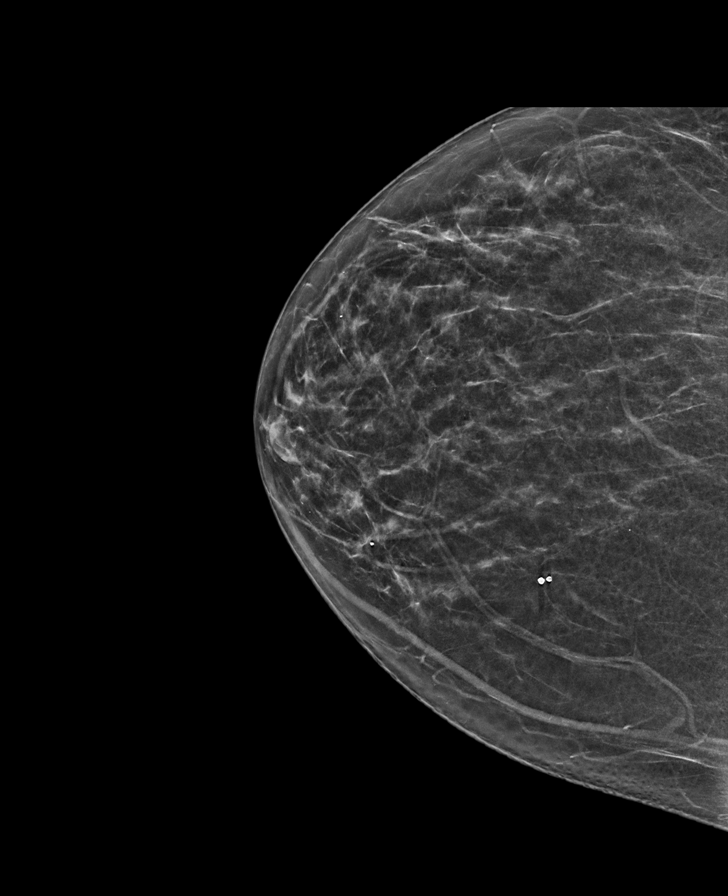

[L CC synth-2D]
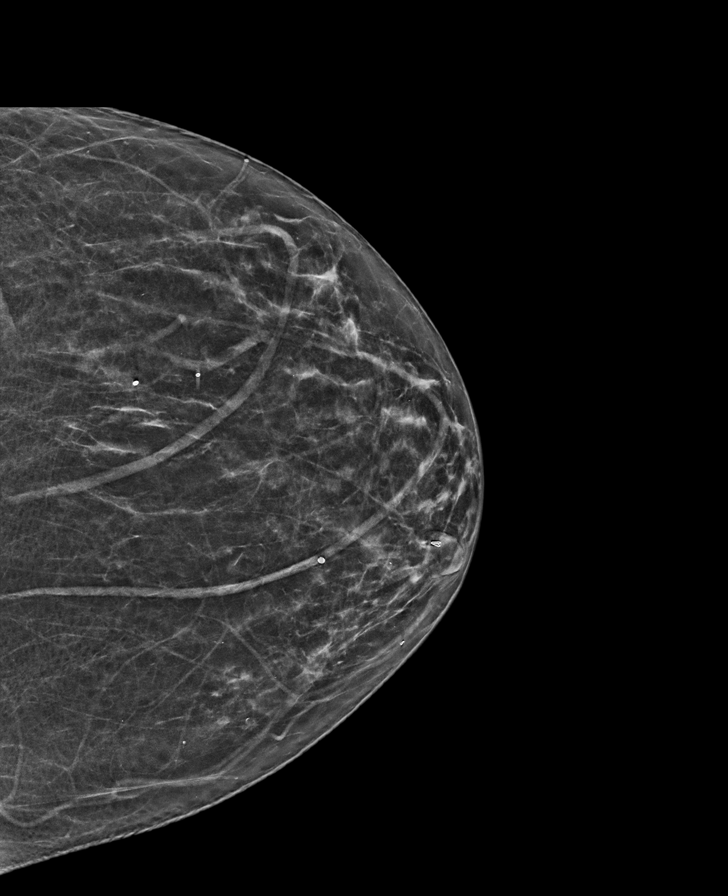

[R MLO synth-2D]
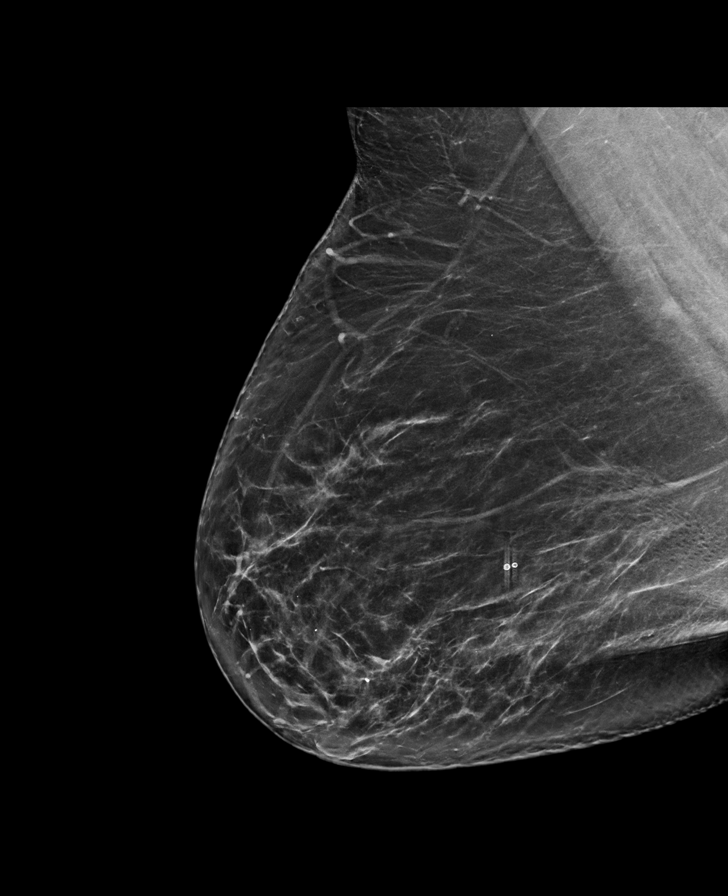

[L MLO synth-2D]
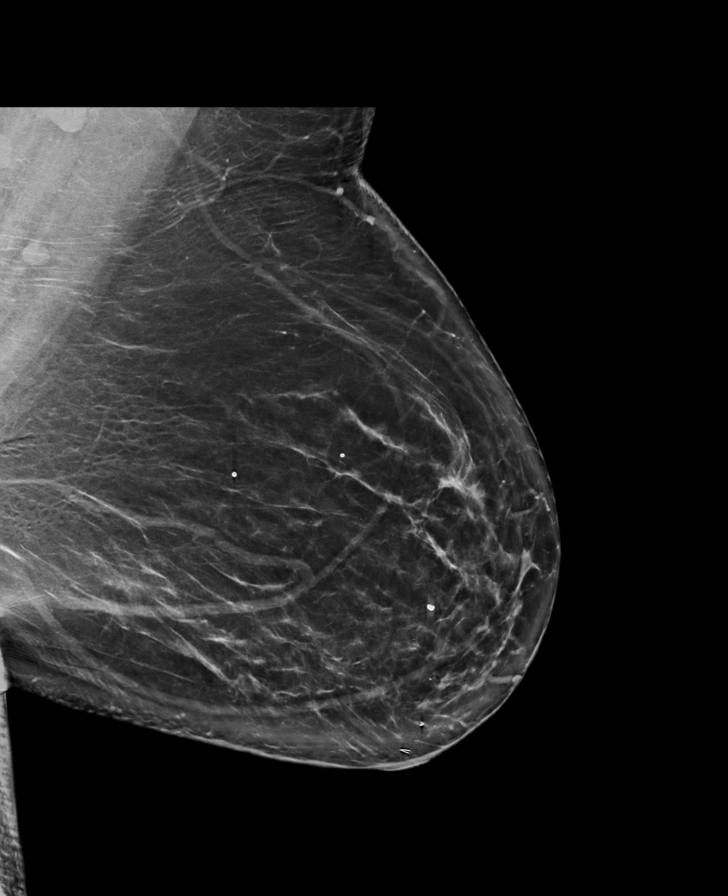

[R CC tomo · tomo slice 33/66.0]
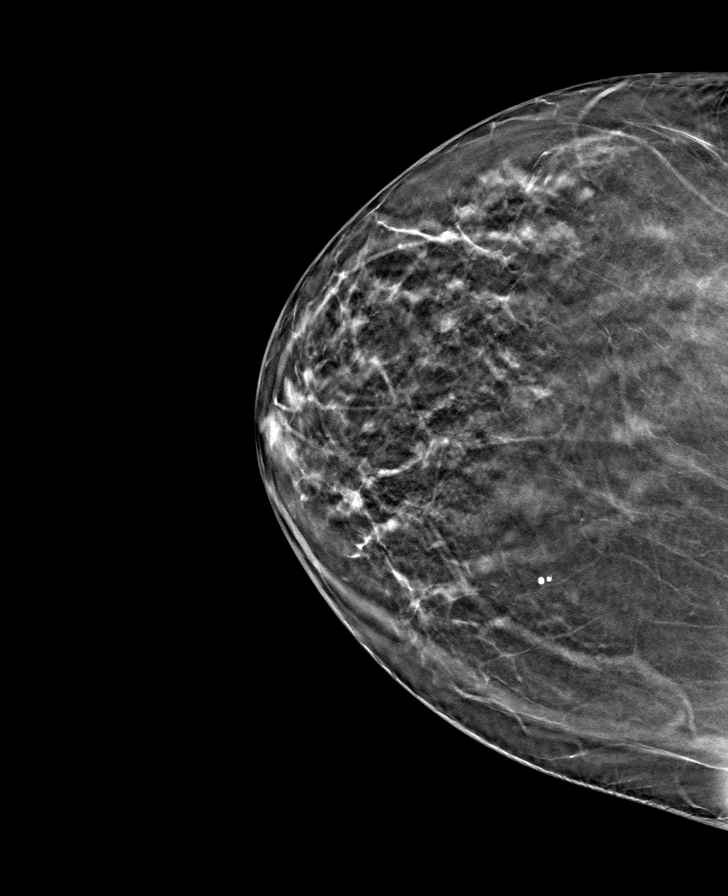

[R MLO tomo · tomo slice 45/89.0]
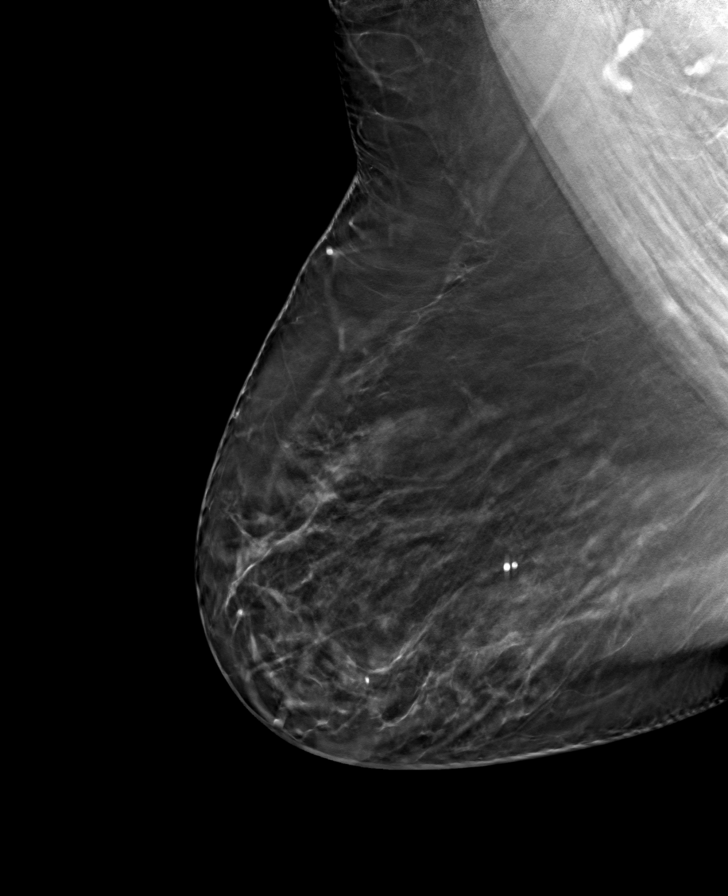

[L CC tomo · tomo slice 33/64.0]
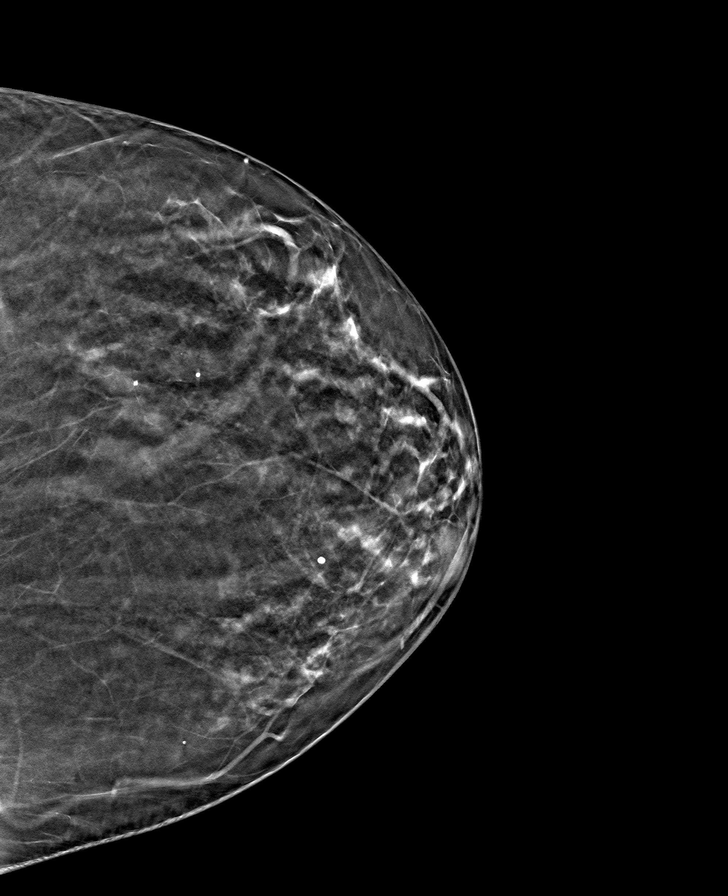

[L MLO tomo · tomo slice 49/96.0]
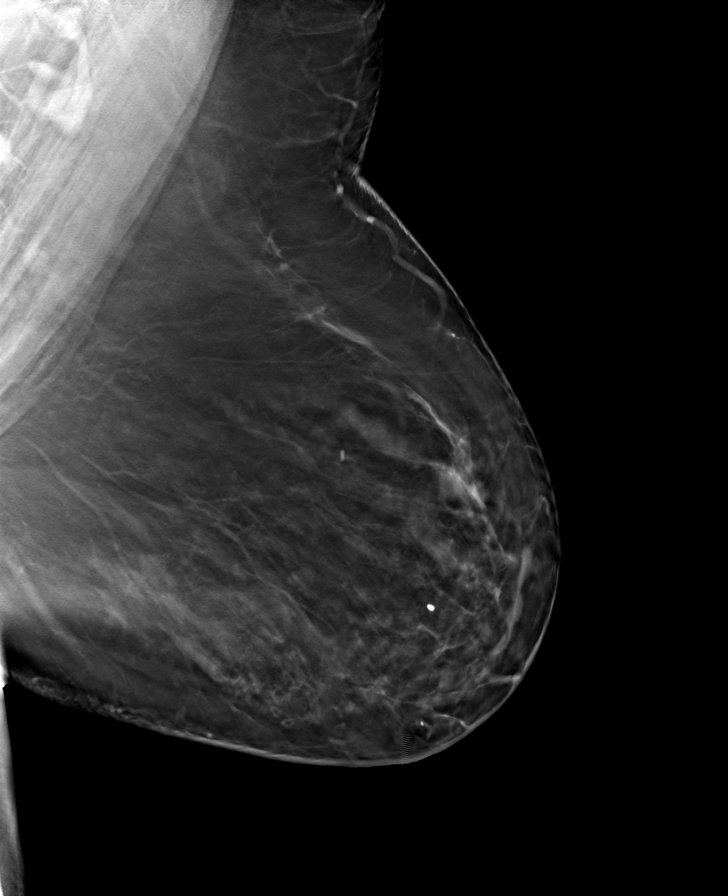

[8 of 24 positions shown; findings below may reference images not displayed]

ACR Breast Density Category b: There are scattered areas of
fibroglandular density.
FINDINGS: There are no findings suspicious for malignancy.
IMPRESSION: No mammographic evidence of malignancy. A result letter of this
screening mammogram will be mailed directly to the patient.

RECOMMENDATION:
Screening mammogram in one year. (Code:51-O-LD2)

BI-RADS CATEGORY  1: Negative.

## 2022-09-06 ENCOUNTER — Other Ambulatory Visit: Payer: Self-pay | Admitting: Obstetrics and Gynecology

## 2022-09-06 DIAGNOSIS — Z1231 Encounter for screening mammogram for malignant neoplasm of breast: Secondary | ICD-10-CM

## 2022-09-27 ENCOUNTER — Ambulatory Visit
Admission: RE | Admit: 2022-09-27 | Discharge: 2022-09-27 | Disposition: A | Discharge status: Home / Self Care | Payer: BC Managed Care – PPO | Source: Ambulatory Visit | Attending: Obstetrics and Gynecology | Admitting: Obstetrics and Gynecology

## 2022-09-27 DIAGNOSIS — Z1231 Encounter for screening mammogram for malignant neoplasm of breast: Secondary | ICD-10-CM

## 2023-08-28 ENCOUNTER — Other Ambulatory Visit: Payer: Self-pay | Admitting: Family Medicine

## 2023-08-28 DIAGNOSIS — Z1231 Encounter for screening mammogram for malignant neoplasm of breast: Secondary | ICD-10-CM

## 2023-10-03 ENCOUNTER — Ambulatory Visit: Payer: BC Managed Care – PPO

## 2023-10-06 ENCOUNTER — Encounter: Payer: Self-pay | Admitting: Physician Assistant

## 2023-10-17 ENCOUNTER — Ambulatory Visit
Admission: RE | Admit: 2023-10-17 | Discharge: 2023-10-17 | Disposition: A | Discharge status: Home / Self Care | Payer: BC Managed Care – PPO | Source: Ambulatory Visit | Attending: Family Medicine | Admitting: Family Medicine

## 2023-10-17 DIAGNOSIS — Z1231 Encounter for screening mammogram for malignant neoplasm of breast: Secondary | ICD-10-CM

## 2023-11-19 ENCOUNTER — Encounter: Payer: Self-pay | Admitting: Physician Assistant

## 2023-11-19 ENCOUNTER — Other Ambulatory Visit (INDEPENDENT_AMBULATORY_CARE_PROVIDER_SITE_OTHER)

## 2023-11-19 ENCOUNTER — Ambulatory Visit: Payer: BC Managed Care – PPO | Admitting: Physician Assistant

## 2023-11-19 VITALS — BP 118/71 | HR 101 | Ht 68.0 in | Wt 285.0 lb

## 2023-11-19 DIAGNOSIS — R160 Hepatomegaly, not elsewhere classified: Secondary | ICD-10-CM | POA: Diagnosis not present

## 2023-11-19 DIAGNOSIS — Z6841 Body Mass Index (BMI) 40.0 and over, adult: Secondary | ICD-10-CM

## 2023-11-19 DIAGNOSIS — K760 Fatty (change of) liver, not elsewhere classified: Secondary | ICD-10-CM

## 2023-11-19 LAB — COMPREHENSIVE METABOLIC PANEL WITH GFR
ALT: 9 U/L (ref 0–35)
AST: 11 U/L (ref 0–37)
Albumin: 4.2 g/dL (ref 3.5–5.2)
Alkaline Phosphatase: 82 U/L (ref 39–117)
BUN: 13 mg/dL (ref 6–23)
CO2: 27 meq/L (ref 19–32)
Calcium: 9.4 mg/dL (ref 8.4–10.5)
Chloride: 97 meq/L (ref 96–112)
Creatinine, Ser: 0.69 mg/dL (ref 0.40–1.20)
GFR: 100.4 mL/min (ref >60.00)
Glucose, Bld: 299 mg/dL — ABNORMAL HIGH (ref 70–99)
Potassium: 3.9 meq/L (ref 3.5–5.1)
Sodium: 134 meq/L — ABNORMAL LOW (ref 135–145)
Total Bilirubin: 0.4 mg/dL (ref 0.2–1.2)
Total Protein: 7.7 g/dL (ref 6.0–8.3)

## 2023-11-19 LAB — CBC WITH DIFFERENTIAL/PLATELET
Basophils Absolute: 0.1 10*3/uL (ref 0.0–0.1)
Basophils Relative: 0.6 % (ref 0.0–3.0)
Eosinophils Absolute: 0.1 10*3/uL (ref 0.0–0.7)
Eosinophils Relative: 1.5 % (ref 0.0–5.0)
HCT: 39.6 % (ref 36.0–46.0)
Hemoglobin: 13.1 g/dL (ref 12.0–15.0)
Lymphocytes Relative: 18.6 % (ref 12.0–46.0)
Lymphs Abs: 1.8 10*3/uL (ref 0.7–4.0)
MCHC: 33 g/dL (ref 30.0–36.0)
MCV: 87.6 fl (ref 78.0–100.0)
Monocytes Absolute: 0.4 10*3/uL (ref 0.1–1.0)
Monocytes Relative: 4 % (ref 3.0–12.0)
Neutro Abs: 7.3 10*3/uL (ref 1.4–7.7)
Neutrophils Relative %: 75.3 % (ref 43.0–77.0)
Platelets: 371 10*3/uL (ref 150.0–400.0)
RBC: 4.52 Mil/uL (ref 3.87–5.11)
RDW: 14.1 % (ref 11.5–15.5)
WBC: 9.7 10*3/uL (ref 4.0–10.5)

## 2023-11-19 LAB — IBC + FERRITIN
Ferritin: 79 ng/mL (ref 10.0–291.0)
Iron: 47 ug/dL (ref 42–145)
Saturation Ratios: 10.1 % — ABNORMAL LOW (ref 20.0–50.0)
TIBC: 463.4 ug/dL — ABNORMAL HIGH (ref 250.0–450.0)
Transferrin: 331 mg/dL (ref 212.0–360.0)

## 2023-11-19 LAB — PROTIME-INR
INR: 1 ratio (ref 0.8–1.0)
Prothrombin Time: 11.1 s (ref 9.6–13.1)

## 2023-11-19 NOTE — Patient Instructions (Addendum)
 Your provider has requested that you go to the basement level for lab work before leaving today. Press "B" on the elevator. The lab is located at the first door on the left as you exit the elevator.  You have been scheduled for an abdominal ultrasound at [facility] (1st floor of hospital) on Friday 11/21/23 at 11 am. Please arrive 30 minutes prior to your appointment for registration. Make certain not to have anything to eat or drink 6 hours prior to your appointment. Should you need to reschedule your appointment, please contact radiology at [phone]. This test typically takes about 30 minutes to perform.  _______________________________________________________  If your blood pressure at your visit was 140/90 or greater, please contact your primary care physician to follow up on this.  _______________________________________________________  If you are age 16 or older, your body mass index should be between 23-30. Your Body mass index is 43.33 kg/m. If this is out of the aforementioned range listed, please consider follow up with your Primary Care Provider.  If you are age 33 or younger, your body mass index should be between 19-25. Your Body mass index is 43.33 kg/m. If this is out of the aformentioned range listed, please consider follow up with your Primary Care Provider.   ________________________________________________________  The [facility] providers would like to encourage you to use [facility] to communicate with providers for non-urgent requests or questions.  Due to long hold times on the telephone, sending your provider a message by [facility] may be a faster and more efficient way to get a response.  Please allow 48 business hours for a response.  Please remember that this is for non-urgent requests.  _______________________________________________________

## 2023-11-19 NOTE — Progress Notes (Signed)
 Chief Complaint: Fatty liver and chronic diarrhea  HPI:    Daisy Turner is a 53 year old female with a past medical history as listed below including diabetes and obesity on Ozempic, who was referred to me by Richmond Campbell., PA-C for a complaint of fatty liver and chronic diarrhea.      07/10/2022 colonoscopy with [facility]-unable to see report.    09/24/2023 abdominal ultrasound with markedly enlarged liver measuring at least 27.9 cm in length, suggestive underlying hepatic steatosis and hepatic vascular congestion.  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  Gallbladder is relatively contracted and filled with gallstones.    11/17/23 CMP with a sodium low at 134, chloride 96, glucose 286, AST low at 9.  Other liver enzymes normal.  CBC normal.  Hemoglobin A1c 11.    Today, patient presents to clinic and explains that she is on Ozempic which has exacerbated her chronic diarrhea, she uses as needed Imodium which seems to work fairly well for her.  Otherwise also on Pravastatin and her hemoglobin A1c has improved from 14 down to 11 recently.  She is headed in the right direction having lost 15 to 20 pounds.  Has never had prior imaging of her liver so was unaware of fatty liver or hepatomegaly.  No family history of liver disease or symptoms.  Previous colonoscopy at [facility] in 2023.  Patient unsure of results.    Denies fever, chills or weight loss.  Past Medical History:  Diagnosis Date   Diabetes mellitus without complication (HCC)    Hiatal hernia    Hypertension    Obesity     Past Surgical History:  Procedure Laterality Date   APPENDECTOMY     BREAST BIOPSY Left 09/22/2014    Current Outpatient Medications  Medication Sig Dispense Refill   aspirin EC 81 MG tablet Take 81 mg by mouth daily.     Lansoprazole (PREVACID PO) Take 1 capsule by mouth daily as needed (reflux).     Norethindrone-Ethinyl Estradiol-Fe Biphas (LO LOESTRIN FE) 1 MG-10 MCG / 10  MCG tablet      TRESIBA FLEXTOUCH 100 UNIT/ML SOPN FlexTouch Pen 22 mLs daily.  5   No current facility-administered medications for this visit.    Allergies as of 11/19/2023   (No Known Allergies)    No family history on file.  Social History   Socioeconomic History   Marital status: Married    Spouse name: Not on file   Number of children: Not on file   Years of education: Not on file   Highest education level: Not on file  Occupational History   Not on file  Tobacco Use   Smoking status: Former    Current packs/day: 0.00    Types: Cigarettes    Quit date: 09/25/2012    Years since quitting: 11.1   Smokeless tobacco: Never  Substance and Sexual Activity   Alcohol use: No   Drug use: No   Sexual activity: Not on file  Other Topics Concern   Not on file  Social History Narrative   Not on file   Social Drivers of Health   Financial Resource Strain: Not on file  Food Insecurity: Low Risk  (11/17/2023)   Received from [facility]   Hunger Vital Sign    Worried About Running Out of Food in the Last Year: Never true    Ran Out of Food in the Last Year: Never true  Transportation Needs: No  Transportation Needs (11/17/2023)   Received from [facility]    In the past 12 months, has lack of reliable transportation kept you from medical appointments, meetings, work or from getting things needed for daily living? : No  Physical Activity: Not on file  Stress: Not on file  Social Connections: Not on file  Intimate Partner Violence: Not on file    Review of Systems:    Constitutional: No weight loss, fever or chills Skin: No rash Cardiovascular: No chest pain Respiratory: No SOB  Gastrointestinal: See HPI and otherwise negative Genitourinary: No dysuria  Neurological: No headache, dizziness or syncope Musculoskeletal: No new muscle or joint pain Hematologic: No bleeding  Psychiatric: No history of depression or anxiety   Physical Exam:  Vital  signs: BP 118/71   Pulse (!) 101   Ht 5\' 8"  (1.727 m)   Wt 285 lb (129.3 kg)   BMI 43.33 kg/m    Constitutional:   Pleasant morbidly obese Caucasian female appears to be in NAD, Well developed, Well nourished, alert and cooperative Head:  Normocephalic and atraumatic. Eyes:   PEERL, EOMI. No icterus. Conjunctiva pink. Ears:  Normal auditory acuity. Neck:  Supple Throat: Oral cavity and pharynx without inflammation, swelling or lesion.  Respiratory: Respirations even and unlabored. Lungs clear to auscultation bilaterally.   No wheezes, crackles, or rhonchi.  Cardiovascular: Normal S1, S2. No MRG. Regular rate and rhythm. No peripheral edema, cyanosis or pallor.  Gastrointestinal:  Soft, nondistended, nontender. No rebound or guarding. Normal bowel sounds. No appreciable masses or hepatomegaly. Rectal:  Not performed.  Msk:  Symmetrical without gross deformities. Without edema, no deformity or joint abnormality.  Neurologic:  Alert and  oriented x4;  grossly normal neurologically.  Skin:   Dry and intact without significant lesions or rashes. Psychiatric: Demonstrates good judgement and reason without abnormal affect or behaviors.  RELEVANT LABS AND IMAGING: CBC    Component Value Date/Time   WBC 9.4 05/21/2016 1206   RBC 4.63 05/21/2016 1206   HGB 12.6 05/21/2016 1206   HCT 39.1 05/21/2016 1206   PLT 341 05/21/2016 1206   MCV 84.4 05/21/2016 1206   MCH 27.2 05/21/2016 1206   MCHC 32.2 05/21/2016 1206   RDW 14.8 05/21/2016 1206    CMP     Component Value Date/Time   NA 136 05/21/2016 1206   K 4.7 05/21/2016 1206   CL 101 05/21/2016 1206   CO2 24 05/21/2016 1206   GLUCOSE 169 (H) 05/21/2016 1206   BUN 10 05/21/2016 1206   CREATININE 0.65 05/21/2016 1206   CALCIUM 9.7 05/21/2016 1206   GFRNONAA >60 05/21/2016 1206   GFRAA >60 05/21/2016 1206    Assessment: 1.  Fatty liver/hepatomegaly: Discussed with patient that I would continue her Statin and trying to get her  diabetes under control on Ozempic, all of this will help with her fatty liver, will do further testing today to ensure there is not an underlying liver process and to test for her level of fibrosis 2.  Diabetes 3.  Morbid obesity  Plan: 1.  Continue meds that she is doing including Ozempic and Pravastatin.  She is headed in the right direction. 2.  Ordered labs including a CBC, CMP, PT/INR, IBC and ferritin, ASA, AMA, ANA, alpha-1 antitrypsin and hepatitis panel 3.  Also ordered ultrasound of the elastography 4.  Patient to follow in clinic per recommendations after above. Assigned to Dr. Lavon Paganini today.  Hyacinth Meeker, PA-C [facility] 11/19/2023, 8:43  AM  Cc: Richmond Campbell., PA-C

## 2023-11-21 ENCOUNTER — Ambulatory Visit (HOSPITAL_COMMUNITY)
Admission: RE | Admit: 2023-11-21 | Discharge: 2023-11-21 | Disposition: A | Discharge status: Home / Self Care | Source: Ambulatory Visit | Attending: Physician Assistant | Admitting: Physician Assistant

## 2023-11-21 DIAGNOSIS — R160 Hepatomegaly, not elsewhere classified: Secondary | ICD-10-CM | POA: Diagnosis present

## 2023-11-21 DIAGNOSIS — K760 Fatty (change of) liver, not elsewhere classified: Secondary | ICD-10-CM | POA: Diagnosis present

## 2023-11-24 LAB — HEPATITIS C ANTIBODY: Hepatitis C Ab: NONREACTIVE

## 2023-11-24 LAB — HEPATITIS B CORE ANTIBODY, TOTAL: Hep B Core Total Ab: NONREACTIVE

## 2023-11-24 LAB — HEPATITIS A ANTIBODY, TOTAL: Hepatitis A AB,Total: NONREACTIVE

## 2023-11-24 LAB — ANA: Anti Nuclear Antibody (ANA): NEGATIVE

## 2023-11-24 LAB — ANTI-SMOOTH MUSCLE ANTIBODY, IGG: Actin (Smooth Muscle) Antibody (IGG): <20 U (ref <20)

## 2023-11-24 LAB — TISSUE TRANSGLUTAMINASE ABS,IGG,IGA
(tTG) Ab, IgA: <1 U/mL
(tTG) Ab, IgG: <1 U/mL

## 2023-11-24 LAB — HEPATITIS B SURFACE ANTIBODY,QUALITATIVE: Hep B S Ab: NONREACTIVE

## 2023-11-24 LAB — HEPATITIS B SURFACE ANTIGEN: Hepatitis B Surface Ag: NONREACTIVE

## 2023-11-24 LAB — MITOCHONDRIAL ANTIBODIES: Mitochondrial M2 Ab, IgG: <=20 U

## 2023-11-24 LAB — ALPHA-1-ANTITRYPSIN: A-1 Antitrypsin, Ser: 232 mg/dL — ABNORMAL HIGH (ref 83–199)

## 2024-08-19 ENCOUNTER — Other Ambulatory Visit: Payer: Self-pay | Admitting: Family Medicine

## 2024-08-19 DIAGNOSIS — Z1231 Encounter for screening mammogram for malignant neoplasm of breast: Secondary | ICD-10-CM

## 2024-10-18 ENCOUNTER — Ambulatory Visit
